# Patient Record
Sex: Female | Born: 1985 | Race: Black or African American | Hispanic: No | Marital: Single | Smoking: Never smoker
Health system: Southern US, Community
[De-identification: ages and names within clinical notes are randomized; demographics above are authoritative.]

## PROBLEM LIST (undated history)

## (undated) DIAGNOSIS — K219 Gastro-esophageal reflux disease without esophagitis: Secondary | ICD-10-CM

## (undated) DIAGNOSIS — I499 Cardiac arrhythmia, unspecified: Secondary | ICD-10-CM

## (undated) DIAGNOSIS — T7840XA Allergy, unspecified, initial encounter: Secondary | ICD-10-CM

## (undated) DIAGNOSIS — R7303 Prediabetes: Secondary | ICD-10-CM

## (undated) HISTORY — DX: Allergy, unspecified, initial encounter: T78.40XA

## (undated) HISTORY — DX: Prediabetes: R73.03

---

## 2009-10-02 ENCOUNTER — Emergency Department: Payer: Self-pay | Admitting: Emergency Medicine

## 2009-10-25 ENCOUNTER — Emergency Department: Payer: Self-pay | Admitting: Emergency Medicine

## 2009-11-07 ENCOUNTER — Emergency Department: Payer: Self-pay | Admitting: Internal Medicine

## 2010-11-06 IMAGING — CR DG CHEST 2V
1 series · 2 of 2 positions shown · non-contrast
Comparison: none

REASON FOR EXAM: SOB
COMMENTS:

[Series 1: view not recorded · 0.17mm/px · 2 of 2 slices shown]
[im 1/2]
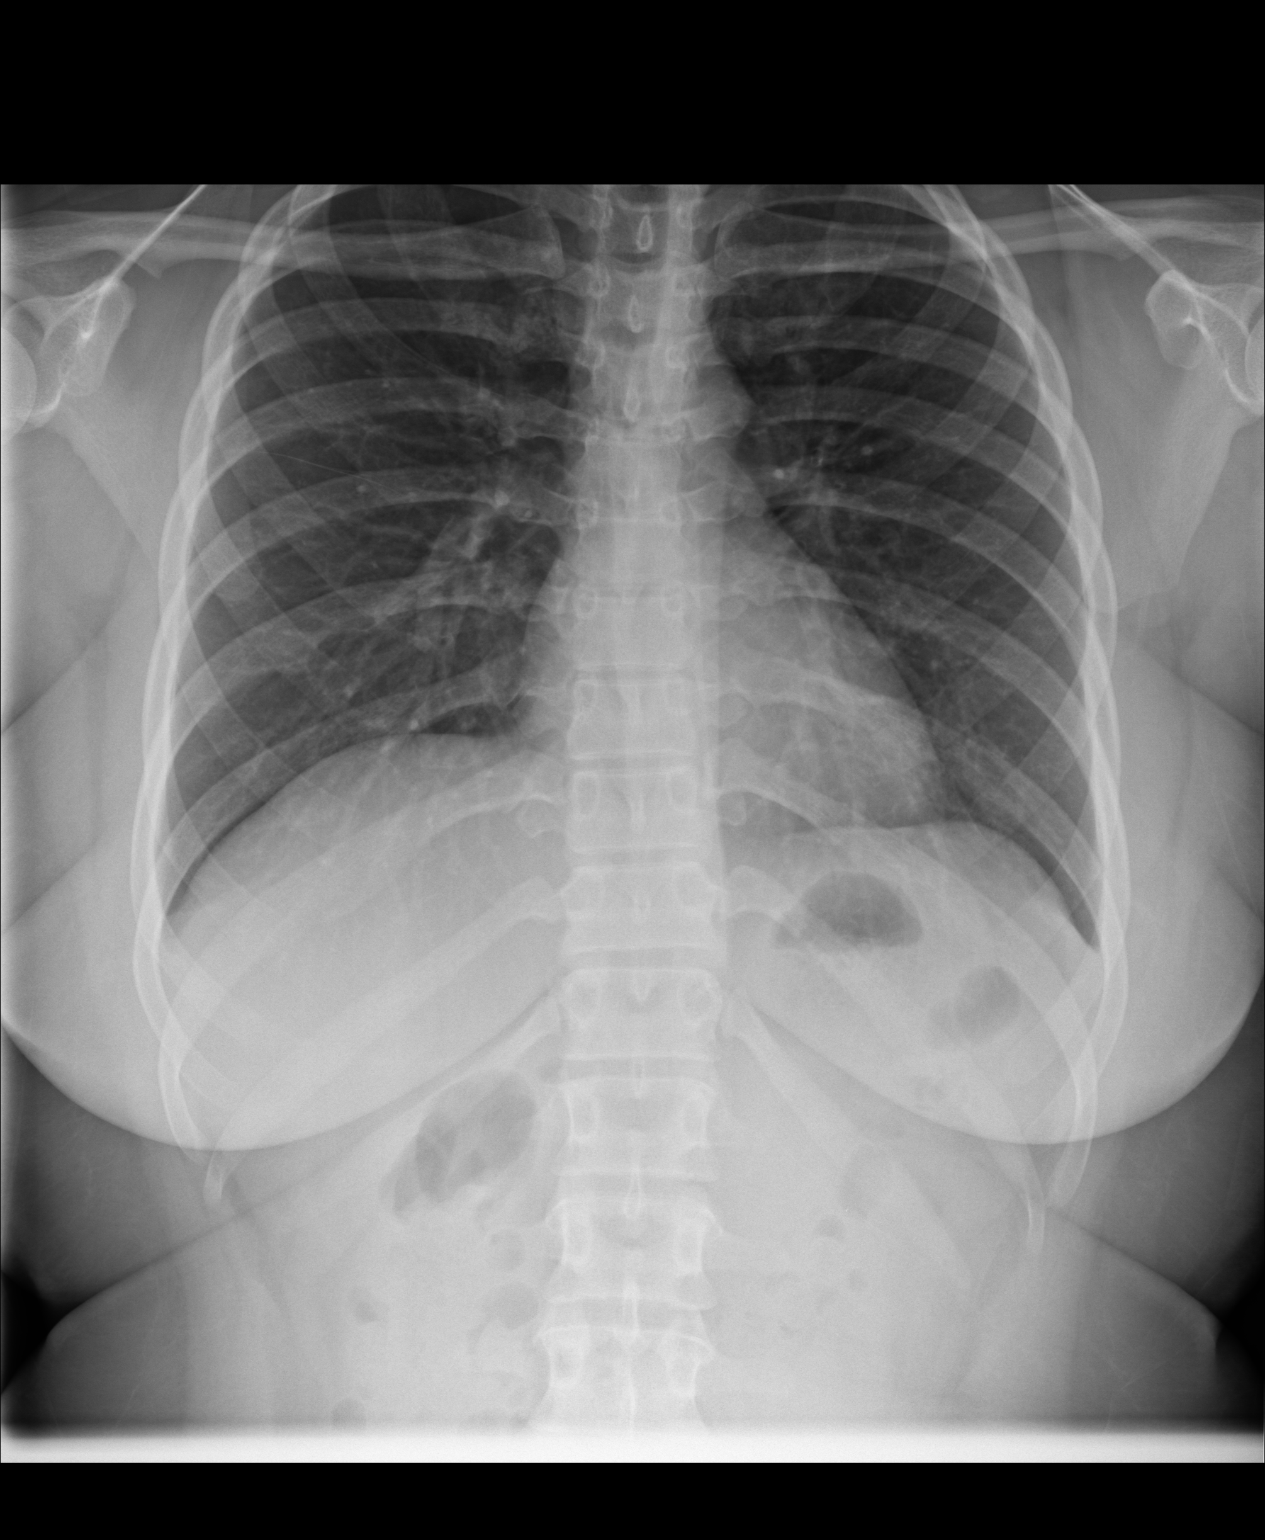
[im 2/2]
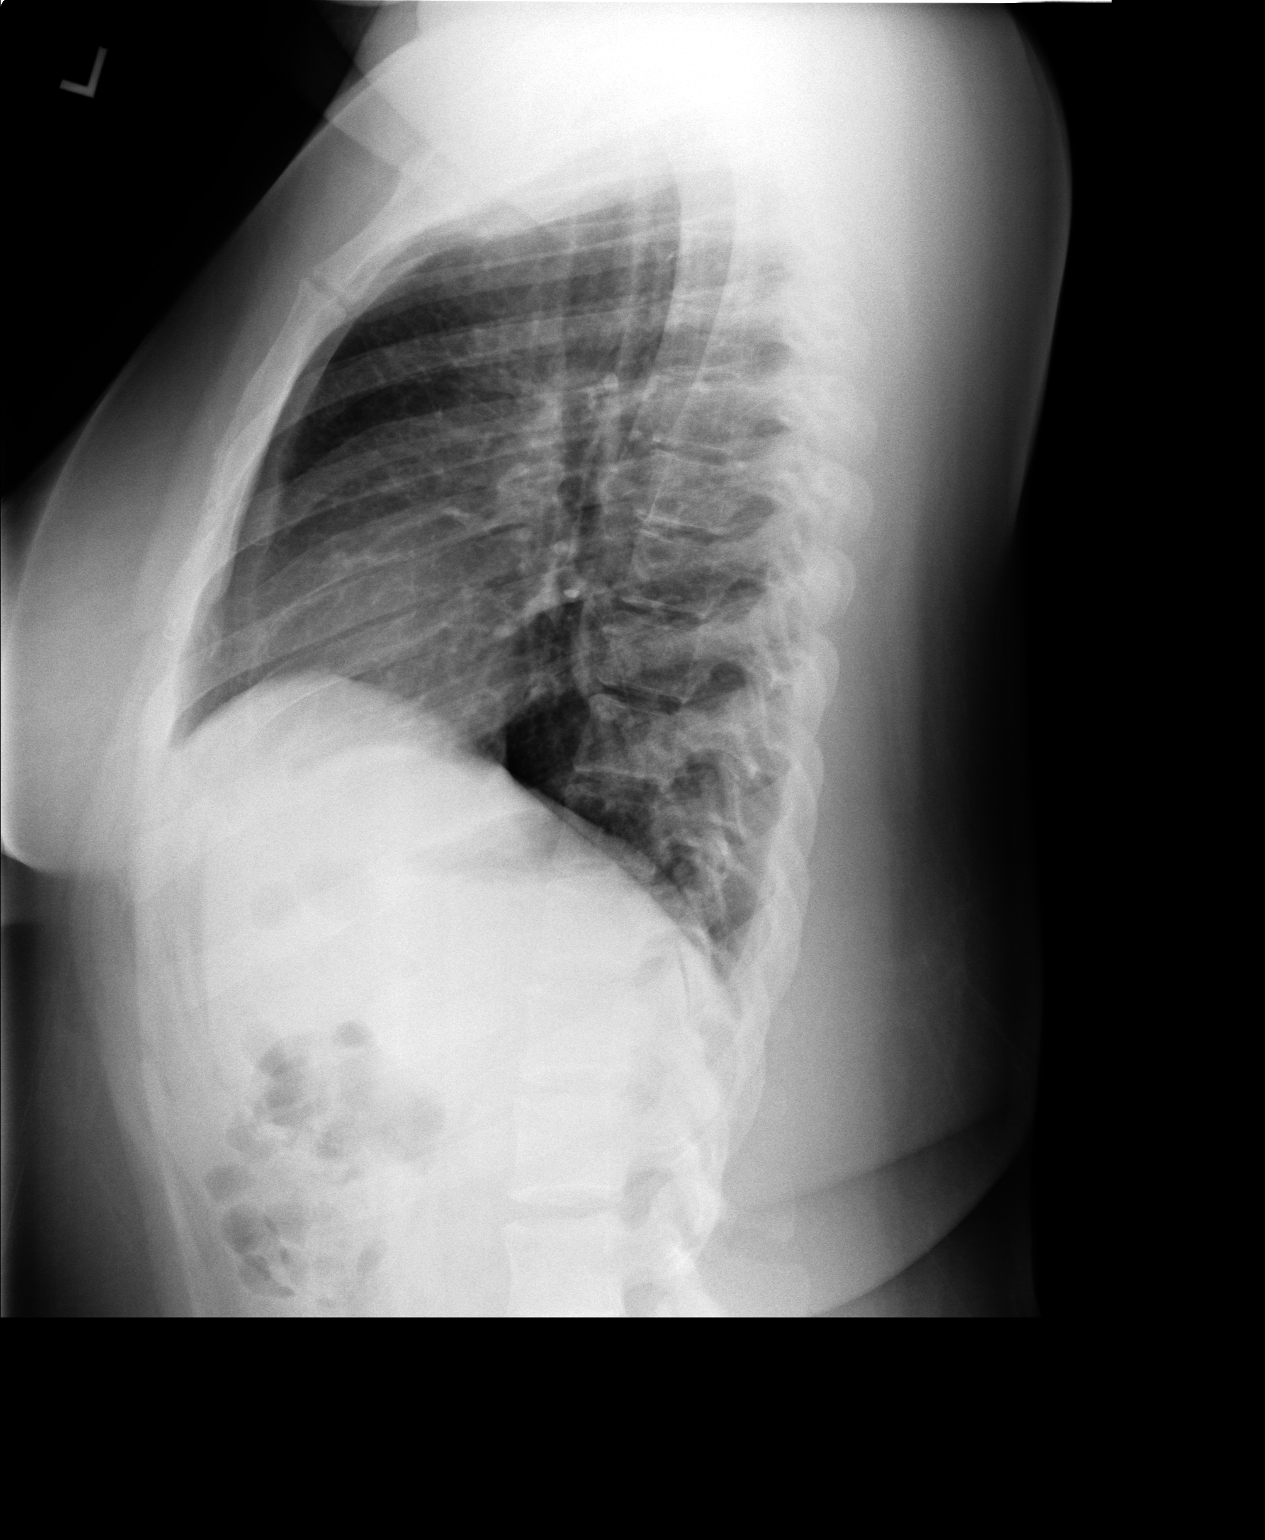

[2 of 2 positions shown; findings below may reference images not displayed]

PROCEDURE:     DXR - DXR CHEST PA (OR AP) AND LATERAL  - November 07, 2009  [DATE]

RESULT:     There is no previous examination for comparison.

The lungs are clear. The heart and pulmonary vessels are normal. The bony
and mediastinal structures are unremarkable. There is no effusion. There is
no pneumothorax or evidence of congestive failure.
IMPRESSION: No acute cardiopulmonary disease.

## 2012-03-17 ENCOUNTER — Emergency Department: Payer: Self-pay | Admitting: Emergency Medicine

## 2014-03-15 ENCOUNTER — Emergency Department: Payer: Self-pay | Admitting: Emergency Medicine

## 2014-03-17 LAB — BETA STREP CULTURE(ARMC)

## 2014-04-19 ENCOUNTER — Emergency Department: Payer: Self-pay | Admitting: Emergency Medicine

## 2014-04-21 LAB — BETA STREP CULTURE(ARMC)

## 2014-08-08 ENCOUNTER — Emergency Department: Payer: Self-pay | Admitting: Emergency Medicine

## 2018-09-29 ENCOUNTER — Other Ambulatory Visit: Payer: Self-pay | Admitting: Obstetrics and Gynecology

## 2020-08-12 ENCOUNTER — Other Ambulatory Visit: Payer: Self-pay

## 2020-08-12 ENCOUNTER — Emergency Department (HOSPITAL_COMMUNITY)
Admission: EM | Admit: 2020-08-12 | Discharge: 2020-08-12 | Disposition: A | Discharge status: Home / Self Care | Payer: 59 | Attending: Emergency Medicine | Admitting: Emergency Medicine

## 2020-08-12 DIAGNOSIS — Z20822 Contact with and (suspected) exposure to covid-19: Secondary | ICD-10-CM | POA: Insufficient documentation

## 2020-08-12 DIAGNOSIS — R112 Nausea with vomiting, unspecified: Secondary | ICD-10-CM | POA: Insufficient documentation

## 2020-08-12 DIAGNOSIS — R5383 Other fatigue: Secondary | ICD-10-CM | POA: Diagnosis not present

## 2020-08-12 DIAGNOSIS — R6883 Chills (without fever): Secondary | ICD-10-CM | POA: Diagnosis present

## 2020-08-12 LAB — SARS CORONAVIRUS 2 BY RT PCR (HOSPITAL ORDER, PERFORMED IN ~~LOC~~ HOSPITAL LAB): SARS Coronavirus 2: NEGATIVE

## 2020-08-12 MED ORDER — ONDANSETRON HCL 4 MG PO TABS: 4.0000 mg | ORAL_TABLET | Freq: Four times a day (QID) | ORAL | 0 refills | Status: DC

## 2020-08-12 NOTE — ED Notes (Signed)
Reviewed D/C instructions w/ pt along w/ follow-up care, how to obtain a PCP and education about bland diets and how to take her nausea medication. Pt verbalized understanding. Pt A&Ox4, VSS, ambulatory w/ steady gait upon departure.

## 2020-08-12 NOTE — ED Triage Notes (Signed)
per pt she has been having chills, fatigue generalized body aches, x 5 days.

## 2020-08-12 NOTE — Discharge Instructions (Addendum)
As discussed, your symptoms are likely due to a viral stomach bug.  You may take the Zofran as needed for nausea.  Start reintroducing food slowly, I have provided a bland diet handout which gives you some examples.  Make sure to return to the ER if you have any new or worsening symptoms.  Please call the phone number in your discharge paperwork in order to establish with a primary care provider.  You may check your Covid test results on MyChart.

## 2020-08-12 NOTE — ED Provider Notes (Addendum)
[facility] EMERGENCY DEPARTMENT Provider Note   CSN: [id number] Arrival date & time: 08/12/20  0351     History Chief Complaint  Patient presents with  . Chills  . Fatigue    Kristina Lopez is a 34 y.o. female.  HPI 34 year old female with no significant medical history presents to the ER with 4 days of nausea, vomiting and chills.  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  States on Wednesday she started to feel unwell and had some episodes of nausea.  On Thursday she began to have several episodes of nonbloody and nonbilious vomiting.  Has been able to keep some crackers down, but overall has not had much of an appetite.  She has had no measurable fevers.  She denies any abdominal pain, vaginal pain, discharge, dysuria, cough, runny nose, back pain, hematuria, sore throat, ear pain, headaches.  Has not taken anything for her symptoms.  She is not vaccinated for, has plans to go get vaccinated tomorrow.    No past medical history on file.  There are no problems to display for this patient.   The histories are not reviewed yet. Please review them in the "History" navigator section and refresh this SmartLink.   OB History   No obstetric history on file.     No family history on file.  Social History   Tobacco Use  . Smoking status: Not on file  Substance Use Topics  . Alcohol use: Not on file  . Drug use: Not on file    Home Medications Prior to Admission medications   Medication Sig Start Date End Date Taking? Authorizing Provider  ondansetron (ZOFRAN) 4 MG tablet Take 1 tablet (4 mg total) by mouth every 6 (six) hours. 08/12/20   Mare Ferrari, PA-C    Allergies    Patient has no allergy information on record.  Review of Systems   Review of Systems  Constitutional: Positive for chills. Negative for fever.  Respiratory: Negative for cough and shortness of breath.   Cardiovascular: Negative for chest pain.   Gastrointestinal: Positive for nausea and vomiting. Negative for constipation and diarrhea.  Genitourinary: Negative for dysuria, vaginal discharge and vaginal pain.  Musculoskeletal: Negative for back pain.  Neurological: Negative for headaches.    Physical Exam Updated Vital Signs BP 104/63 (BP Location: Right Arm)   Pulse 80   Temp 98.4 F (36.9 C) (Oral)   Resp 16   SpO2 100%   Physical Exam Vitals and nursing note reviewed.  Constitutional:      General: She is not in acute distress.    Appearance: She is well-developed.     Comments: Very well-appearing, sitting up in the ER bed  HENT:     Head: Normocephalic and atraumatic.     Mouth/Throat:     Mouth: Mucous membranes are moist.     Pharynx: Oropharynx is clear.  Eyes:     Conjunctiva/sclera: Conjunctivae normal.  Cardiovascular:     Rate and Rhythm: Normal rate and regular rhythm.     Pulses: Normal pulses.     Heart sounds: Normal heart sounds. No murmur heard.   Pulmonary:     Effort: Pulmonary effort is normal. No respiratory distress.     Breath sounds: Normal breath sounds.     Comments: Respirations equal and unlabored, patient able to speak in full sentences, lungs clear to auscultation bilaterally  Abdominal:     General: Abdomen  is flat.     Palpations: Abdomen is soft.     Tenderness: There is no abdominal tenderness.  Musculoskeletal:        General: Normal range of motion.     Cervical back: Normal range of motion and neck supple.     Comments: Moving all 4 extremities without difficulty  Skin:    General: Skin is warm and dry.  Neurological:     Mental Status: She is alert.     ED Results / Procedures / Treatments   Labs (all labs ordered are listed, but only abnormal results are displayed) Labs Reviewed  SARS CORONAVIRUS 2 BY RT PCR (HOSPITAL ORDER, PERFORMED IN [facility])    EKG None  Radiology No results found.  Procedures Procedures (including critical care  time)  Medications Ordered in ED Medications - No data to display  ED Course  I have reviewed the triage vital signs and the nursing notes.  Pertinent labs & imaging results that were available during my care of the patient were reviewed by me and considered in my medical decision making (see chart for details).    MDM Rules/Calculators/A&P                         34 year old female with several days of nausea, vomiting and chills.  Overall very well-appearing, appears to be nontoxic, no acute distress.  Abdomen soft and nontender.  Lungs clear to auscultation.  Throat without evidence of erythema.  No flank tenderness.  Patient denies any dysuria, vaginal pain or discharge.  No signs of a URI.   Covid test is negative here today. Do not think she needs any additional labwork at this time. Does not appear dry, mucous membranes moist.  Suspect she has some viral gastroenteritis.  I offered the patient some IV fluids however she denied this.  States that she still has been able to eat and drink some.  Will provide the patient with some Zofran, and very strict return precautions discussed.  She voices understanding is agreeable.  Patient does not have a primary care provider, I encouraged the patient to call the phone number in the discharge paperwork in order to establish with 1.  Patient was requesting Covid test results, and I directed her to quadrant.  Overall, the patient is medically screened and is stable for discharge.  Final Clinical Impression(s) / ED Diagnoses Final diagnoses:  Non-intractable vomiting with nausea, unspecified vomiting type    Rx / DC Orders ED Discharge Orders         Ordered    ondansetron (ZOFRAN) 4 MG tablet  Every 6 hours        08/12/20 1522               Leone Brand 08/12/20 1527    Gwyneth Sprout, MD 08/12/20 2030

## 2022-02-14 ENCOUNTER — Encounter (HOSPITAL_COMMUNITY): Payer: Self-pay | Admitting: Emergency Medicine

## 2022-02-14 ENCOUNTER — Emergency Department (HOSPITAL_COMMUNITY): Payer: 59

## 2022-02-14 ENCOUNTER — Other Ambulatory Visit: Payer: Self-pay

## 2022-02-14 ENCOUNTER — Emergency Department (HOSPITAL_COMMUNITY)
Admission: EM | Admit: 2022-02-14 | Discharge: 2022-02-14 | Disposition: A | Discharge status: Home / Self Care | Payer: 59 | Attending: Emergency Medicine | Admitting: Emergency Medicine

## 2022-02-14 DIAGNOSIS — M25512 Pain in left shoulder: Secondary | ICD-10-CM | POA: Diagnosis present

## 2022-02-14 DIAGNOSIS — M7918 Myalgia, other site: Secondary | ICD-10-CM

## 2022-02-14 DIAGNOSIS — R0789 Other chest pain: Secondary | ICD-10-CM | POA: Insufficient documentation

## 2022-02-14 HISTORY — DX: Cardiac arrhythmia, unspecified: I49.9

## 2022-02-14 HISTORY — DX: Gastro-esophageal reflux disease without esophagitis: K21.9

## 2022-02-14 LAB — CBC WITH DIFFERENTIAL/PLATELET
Abs Immature Granulocytes: 0.01 10*3/uL (ref 0.00–0.07)
Basophils Absolute: 0 10*3/uL (ref 0.0–0.1)
Basophils Relative: 1 %
Eosinophils Absolute: 0 10*3/uL (ref 0.0–0.5)
Eosinophils Relative: 0 %
HCT: 35.9 % — ABNORMAL LOW (ref 36.0–46.0)
Hemoglobin: 11.6 g/dL — ABNORMAL LOW (ref 12.0–15.0)
Immature Granulocytes: 0 %
Lymphocytes Relative: 38 %
Lymphs Abs: 1.8 10*3/uL (ref 0.7–4.0)
MCH: 29.6 pg (ref 26.0–34.0)
MCHC: 32.3 g/dL (ref 30.0–36.0)
MCV: 91.6 fL (ref 80.0–100.0)
Monocytes Absolute: 0.3 10*3/uL (ref 0.1–1.0)
Monocytes Relative: 6 %
Neutro Abs: 2.7 10*3/uL (ref 1.7–7.7)
Neutrophils Relative %: 55 %
Platelets: 274 10*3/uL (ref 150–400)
RBC: 3.92 MIL/uL (ref 3.87–5.11)
RDW: 12.5 % (ref 11.5–15.5)
WBC: 4.8 10*3/uL (ref 4.0–10.5)
nRBC: 0 % (ref 0.0–0.2)

## 2022-02-14 LAB — TROPONIN I (HIGH SENSITIVITY): Troponin I (High Sensitivity): <2 ng/L (ref <18)

## 2022-02-14 LAB — BASIC METABOLIC PANEL
Anion gap: 9 (ref 5–15)
BUN: 9 mg/dL (ref 6–20)
CO2: 25 mmol/L (ref 22–32)
Calcium: 9.2 mg/dL (ref 8.9–10.3)
Chloride: 103 mmol/L (ref 98–111)
Creatinine, Ser: 0.75 mg/dL (ref 0.44–1.00)
GFR, Estimated: >60 mL/min (ref >60)
Glucose, Bld: 89 mg/dL (ref 70–99)
Potassium: 4.1 mmol/L (ref 3.5–5.1)
Sodium: 137 mmol/L (ref 135–145)

## 2022-02-14 MED ORDER — DICLOFENAC SODIUM 50 MG PO TBEC: 50.0000 mg | DELAYED_RELEASE_TABLET | Freq: Two times a day (BID) | ORAL | 0 refills | Status: AC

## 2022-02-14 MED ORDER — LIDOCAINE 5 % EX PTCH
1.0000 | MEDICATED_PATCH | CUTANEOUS | 0 refills | Status: DC
Start: 2022-02-14 — End: 2022-03-13

## 2022-02-14 MED ORDER — METHOCARBAMOL 500 MG PO TABS: 500.0000 mg | ORAL_TABLET | Freq: Two times a day (BID) | ORAL | 0 refills | Status: DC

## 2022-02-14 NOTE — ED Provider Notes (Signed)
[facility] EMERGENCY DEPARTMENT Provider Note   CSN: [id number] Arrival date & time: 02/14/22  1011     History  Chief Complaint  Patient presents with   Chest Pain    Kristina Lopez is a 36 y.o. female.  36 year old female presents with complaint of a sharp pain in her left scapular area that started on Monday, has slowly progressed to her generalized shoulder area and now into her left pectoral area.  Pain occasionally shoots down towards her elbow.  Is reproduced with palpation and movement.  Denies associated shortness of breath, diaphoresis, nausea or vomiting.  States that she did go to a cardiologist last month when she was having episode of lightheadedness, had a Holter monitor and shows me the report which shows she had 2 episodes with a heart rate of 102 corresponding with her symptoms, otherwise unremarkable. Patient is otherwise healthy, non-smoker, no family cardiac history.      Home Medications Prior to Admission medications   Medication Sig Start Date End Date Taking? Authorizing Provider  diclofenac (VOLTAREN) 50 MG EC tablet Take 1 tablet (50 mg total) by mouth 2 (two) times daily for 10 days. 02/14/22 02/24/22 Yes Jeannie Fend, PA-C  lidocaine (LIDODERM) 5 % Place 1 patch onto the skin daily. Remove & Discard patch within 12 hours or as directed by MD 02/14/22  Yes Jeannie Fend, PA-C  methocarbamol (ROBAXIN) 500 MG tablet Take 1 tablet (500 mg total) by mouth 2 (two) times daily. 02/14/22  Yes Jeannie Fend, PA-C  ondansetron (ZOFRAN) 4 MG tablet Take 1 tablet (4 mg total) by mouth every 6 (six) hours. 08/12/20   Mare Ferrari, PA-C      Allergies    Penicillins    Review of Systems   Review of Systems Negative except as per HPI Physical Exam Updated Vital Signs BP 134/73 (BP Location: Right Arm)    Pulse 94    Temp 99.6 F (37.6 C) (Oral)    Resp 17    Ht 5\' 2"  (1.575 m)    Wt 76.7 kg    LMP 02/14/2022    SpO2 100%    BMI 30.91 kg/m   Physical Exam Vitals and nursing note reviewed.  Constitutional:      General: She is not in acute distress.    Appearance: She is well-developed. She is not diaphoretic.  HENT:     Head: Normocephalic and atraumatic.  Cardiovascular:     Rate and Rhythm: Normal rate and regular rhythm.     Heart sounds: Normal heart sounds. No murmur heard. Pulmonary:     Effort: Pulmonary effort is normal.     Breath sounds: Normal breath sounds.  Chest:     Chest wall: Tenderness present.    Musculoskeletal:     Cervical back: Neck supple.       Back:     Right lower leg: No edema.     Left lower leg: No edema.  Skin:    General: Skin is warm and dry.     Findings: No erythema or rash.  Neurological:     Mental Status: She is alert and oriented to person, place, and time.  Psychiatric:        Behavior: Behavior normal.    ED Results / Procedures / Treatments   Labs (all labs ordered are listed, but only abnormal results are displayed) Labs Reviewed  CBC WITH DIFFERENTIAL/PLATELET - Abnormal; Notable for the following components:  Result Value   Hemoglobin 11.6 (*)    HCT 35.9 (*)    All other components within normal limits  BASIC METABOLIC PANEL  TROPONIN I (HIGH SENSITIVITY)    EKG None  Radiology DG Chest 2 View  Result Date: 02/14/2022 CLINICAL DATA:  Chest pain. EXAM: CHEST - 2 VIEW COMPARISON:  November 07, 2009. FINDINGS: No consolidation. No visible pleural effusions or pneumothorax. Cardiomediastinal silhouette is within normal limits and similar to prior. No evidence of acute osseous abnormality. Mild S-shaped thoracolumbar curvature. IMPRESSION: No evidence of acute cardiopulmonary disease. Electronically Signed   By: Feliberto Harts M.D.   On: 02/14/2022 11:04    Procedures Procedures    Medications Ordered in ED Medications - No data to display  ED Course/ Medical Decision Making/ A&P Clinical Course as of 02/14/22 1225  Thu Feb 14, 2022  3252  36 year old female with complaint of pain in her left shoulder and left pectoral areas as above.  Pain is reproduced with palpation of these areas as well as with range of motion of left shoulder. Vital signs are reassuring, work-up overall reassuring including CBC with normal white blood cell count, no profound anemia, BMP without electrolyte disturbance, normal renal function.  Troponin is less than 2.  EKG without ischemic changes.  Chest x-ray is unremarkable. As pain is reproduced with palpation and movement, suspect musculoskeletal in nature, will treat with Lidoderm patch, Robaxin and diclofenac.  Recommend compresses for 20 minutes at a time followed by gentle stretching.  Advised to recheck with her PCP if her symptoms continue. [LM]    Clinical Course User Index [LM] Jeannie Fend, PA-C                           Medical Decision Making         Final Clinical Impression(s) / ED Diagnoses Final diagnoses:  Musculoskeletal pain    Rx / DC Orders ED Discharge Orders          Ordered    lidocaine (LIDODERM) 5 %  Every 24 hours        02/14/22 1219    methocarbamol (ROBAXIN) 500 MG tablet  2 times daily        02/14/22 1219    diclofenac (VOLTAREN) 50 MG EC tablet  2 times daily        02/14/22 1219              Alden Hipp 02/14/22 1225    Pollyann Savoy, MD 02/14/22 1524

## 2022-02-14 NOTE — Discharge Instructions (Signed)
Apply Lidoderm patch as prescribed.  Take diclofenac and Robaxin as needed as prescribed.  Apply warm compresses to the area followed by gentle stretching as discussed.  Follow-up with your primary care provider if not improving, he may benefit from a referral to physical therapy.

## 2022-02-14 NOTE — ED Notes (Signed)
Monitor and signature pad unavailable for d/c vitals.

## 2022-02-14 NOTE — ED Provider Triage Note (Signed)
Emergency Medicine Provider Triage Evaluation Note  Kristina Lopez , a 36 y.o. female  was evaluated in triage.  Pt complains of chest pain.  Patient states that beginning Monday she started noticing pain in her left scapula.  She states that it is slowly migrated to left-sided chest pain that she describes as heaviness and constant with episodes of short sharp shooting pain.  She states that she also has pain that has radiated down her left arm to her left elbow that she describes as sharp.  She does endorse mild shortness of breath intermittently as well as palpitations.  She denies any lightheadedness or dizziness.  She was seen by cardiologist in January and had a Holter monitor which showed episodes of tachycardia as well as a few supraventricular beats.  She was placed on magnesium for this. Review of Systems  Positive: See above Negative:   Physical Exam  BP 134/73 (BP Location: Right Arm)    Pulse 94    Temp 99.6 F (37.6 C) (Oral)    Resp 17    Ht 5\' 2"  (1.575 m)    Wt 76.7 kg    LMP 02/14/2022    SpO2 100%    BMI 30.91 kg/m  Gen:   Awake, no distress   Resp:  Normal effort  MSK:   Moves extremities without difficulty  Other:  S1/S2 without murmur or tachycardia.  Lungs clear to auscultation bilaterally  Medical Decision Making  Medically screening exam initiated at 10:36 AM.  Appropriate orders placed.  Kristina Lopez was informed that the remainder of the evaluation will be completed by another provider, this initial triage assessment does not replace that evaluation, and the importance of remaining in the ED until their evaluation is complete.     Pamalee Leyden, PA-C 02/14/22 1037

## 2022-02-14 NOTE — ED Notes (Signed)
Pt escorted to lobby. Verbalized understanding discharge instructions. In no acute distress.

## 2022-02-14 NOTE — ED Triage Notes (Signed)
Patient reports Monday morning began having left shoulder pain then Tuesday moved into left chest and shoulder.

## 2022-03-11 ENCOUNTER — Ambulatory Visit: Payer: 59 | Admitting: Nurse Practitioner

## 2022-03-13 ENCOUNTER — Ambulatory Visit: Payer: 59 | Admitting: Nurse Practitioner

## 2022-03-13 ENCOUNTER — Encounter: Payer: Self-pay | Admitting: Nurse Practitioner

## 2022-03-13 VITALS — BP 107/72 | HR 83 | Temp 97.1°F | Ht 62.0 in | Wt 162.6 lb

## 2022-03-13 DIAGNOSIS — R42 Dizziness and giddiness: Secondary | ICD-10-CM | POA: Diagnosis not present

## 2022-03-13 DIAGNOSIS — K219 Gastro-esophageal reflux disease without esophagitis: Secondary | ICD-10-CM | POA: Insufficient documentation

## 2022-03-13 DIAGNOSIS — R7303 Prediabetes: Secondary | ICD-10-CM | POA: Diagnosis not present

## 2022-03-13 DIAGNOSIS — G44209 Tension-type headache, unspecified, not intractable: Secondary | ICD-10-CM | POA: Diagnosis not present

## 2022-03-13 MED ORDER — OMEPRAZOLE 20 MG PO CPDR: 20.0000 mg | DELAYED_RELEASE_CAPSULE | Freq: Every day | ORAL | 1 refills | Status: DC

## 2022-03-13 NOTE — Assessment & Plan Note (Signed)
A1c on 10/2021 was 5.8% discussed that this is considered prediabetes.  Encouraged her to limit her sugars, carbohydrates, limit white bread, pasta and that she can eat whole grains.  This is especially important with her family history of diabetes as we do not want this to progress there.  We will check A1c next visit. ?

## 2022-03-13 NOTE — Assessment & Plan Note (Addendum)
She has recent history of lightheadedness and palpitations.  She has seen cardiology and work-up has been negative.  They started her on magnesium which has helped her symptoms and she denies any recent lightheadedness or palpitations.  She stated that she is being referred for sleep study and will have that at the end of this month.  Encouraged her to make sure she is drinking plenty of fluid.  Last CMP, CBC reviewed from 01/2022.  Continue collaboration recommendations from cardiology. ?

## 2022-03-13 NOTE — Assessment & Plan Note (Signed)
Intermittent tension headaches, she states they do not happen frequently.  Discussed preventing them with getting adequate sleep and limiting stress.  She can take ibuprofen or Tylenol as needed and also try a hot shower or heat with active headaches.  Follow-up if symptoms are occurring more frequently or with any concerns. ?

## 2022-03-13 NOTE — Progress Notes (Addendum)
? ?New Patient Office Visit ? ?Subjective:  ?Patient ID: Kristina Lopez, female    DOB: 07/29/1986  Age: 36 y.o. MRN: [id number] ? ?CC:  ?Chief Complaint  ?Patient presents with  ? Establish Care  ?  Np. Est care. No main concerns.  ? ? ?HPI ?Kristina LeydenWhitney R Lopez presents for new patient visit to establish care.  Introduced to Publishing rights managernurse practitioner role and practice setting.  All questions answered.  Discussed provider/patient relationship and expectations. ? ?Kristina Lopez has a recent history of lightheadedness and palpitations.  She was sent to cardiology and wore a heart monitor for 48 hours which did not show anything according to her.  She was started on magnesium which has helped her symptoms.  She was also told that she might have sleep apnea and has a plan for a sleep study at the end of this month.  She does not drink caffeine. ? ?She has a history of ongoing acid reflux.  She has tried Pepcid over-the-counter which did not help her symptoms.  She has been avoiding triggers such as caffeine and chocolate.  She has the symptoms multiple times a week.  She denies nausea, vomiting, constipation, diarrhea. ? ?She endorses headaches intermittently. She went to urgent care and was diagnosed with tension headaches. The pain is located in the back of her head. She tried tylenol and ibuprofen however this does not always help. She denies stress. This is associated with neck pain. She says she has a light headache right now.  She denies nausea and vomiting. ? ?Depression and anxiety screening done: ? ? ?  03/13/2022  ?  3:38 PM  ?Depression screen PHQ 2/9  ?Decreased Interest 1  ?Down, Depressed, Hopeless 1  ?PHQ - 2 Score 2  ?Altered sleeping 1  ?Tired, decreased energy 1  ?Change in appetite 0  ?Feeling bad or failure about yourself  0  ?Trouble concentrating 0  ?Moving slowly or fidgety/restless 0  ?Suicidal thoughts 0  ?PHQ-9 Score 4  ?Difficult doing work/chores Somewhat difficult  ? ? ?  03/13/2022  ?  3:38 PM  ?GAD 7 :  Generalized Anxiety Score  ?Nervous, Anxious, on Edge 0  ?Control/stop worrying 0  ?Worry too much - different things 1  ?Trouble relaxing 0  ?Restless 0  ?Easily annoyed or irritable 0  ?Afraid - awful might happen 0  ?Total GAD 7 Score 1  ?Anxiety Difficulty Somewhat difficult  ? ? ?Past Medical History:  ?Diagnosis Date  ? Allergy   ? GERD (gastroesophageal reflux disease)   ? Irregular heart beat   ? Prediabetes   ? ? ?History reviewed. No pertinent surgical history. ? ?Family History  ?Problem Relation Age of Onset  ? Alcohol abuse Father   ? Diabetes Father   ? Asthma Sister   ? Diabetes Maternal Aunt   ? COPD Maternal Uncle   ? Diabetes Paternal Aunt   ? Cancer Maternal Grandmother   ?     stomach  ? Diabetes Maternal Grandmother   ? Stroke Maternal Grandmother   ? ? ?Social History  ? ?Socioeconomic History  ? Marital status: Single  ?  Spouse name: Not on file  ? Number of children: Not on file  ? Years of education: Not on file  ? Highest education level: Not on file  ?Occupational History  ? Not on file  ?Tobacco Use  ? Smoking status: Never  ? Smokeless tobacco: Never  ?Vaping Use  ? Vaping Use: Never used  ?Substance  and Sexual Activity  ? Alcohol use: Never  ? Drug use: Never  ? Sexual activity: Yes  ?Other Topics Concern  ? Not on file  ?Social History Narrative  ? Not on file  ? ?Social Determinants of Health  ? ?Financial Resource Strain: Not on file  ?Food Insecurity: Not on file  ?Transportation Needs: Not on file  ?Physical Activity: Not on file  ?Stress: Not on file  ?Social Connections: Not on file  ?Intimate Partner Violence: Not on file  ? ? ?ROS ?Review of Systems  ?Constitutional:  Positive for fatigue.  ?HENT: Negative.    ?Eyes: Negative.   ?Respiratory: Negative.    ?Cardiovascular: Negative.   ?Gastrointestinal:  Negative for blood in stool, constipation, diarrhea, nausea and vomiting.  ?     Heart burn  ?Endocrine: Negative.   ?Genitourinary: Negative.   ?Musculoskeletal: Negative.    ?Skin: Negative.   ?Neurological:  Positive for headaches. Negative for dizziness.  ?Psychiatric/Behavioral: Negative.    ? ?Objective:  ? ?Today's Vitals: BP 107/72 (BP Location: Left Arm, Patient Position: Sitting, Cuff Size: Normal)   Pulse 83   Temp (!) 97.1 ?F (36.2 ?C) (Temporal)   Ht 5\' 2"  (1.575 m)   Wt 162 lb 9.6 oz (73.8 kg)   LMP 03/10/2022   SpO2 100%   BMI 29.74 kg/m?  ? ?Physical Exam ?Vitals and nursing note reviewed.  ?Constitutional:   ?   General: She is not in acute distress. ?   Appearance: Normal appearance.  ?HENT:  ?   Head: Normocephalic.  ?Eyes:  ?   Conjunctiva/sclera: Conjunctivae normal.  ?Cardiovascular:  ?   Rate and Rhythm: Normal rate and regular rhythm.  ?   Pulses: Normal pulses.  ?   Heart sounds: Normal heart sounds.  ?Pulmonary:  ?   Effort: Pulmonary effort is normal.  ?   Breath sounds: Normal breath sounds.  ?Abdominal:  ?   Palpations: Abdomen is soft.  ?   Tenderness: There is no abdominal tenderness.  ?Musculoskeletal:  ?   Cervical back: Normal range of motion. No tenderness.  ?   Right lower leg: No edema.  ?   Left lower leg: No edema.  ?Lymphadenopathy:  ?   Cervical: No cervical adenopathy.  ?Skin: ?   General: Skin is warm.  ?Neurological:  ?   General: No focal deficit present.  ?   Mental Status: She is alert and oriented to person, place, and time.  ?Psychiatric:     ?   Mood and Affect: Mood normal.     ?   Behavior: Behavior normal.     ?   Thought Content: Thought content normal.     ?   Judgment: Judgment normal.  ? ? ?Assessment & Plan:  ? ?Problem List Items Addressed This Visit   ? ?  ? Digestive  ? Gastroesophageal reflux disease  ?  Chronic, not controlled.  She has tried Pepcid over-the-counter which has not helped her symptoms.  She has been avoiding foods that trigger her symptoms like caffeine and chocolate.  We will start omeprazole 20 mg daily.  Discussed eating small, frequent meals and not laying down after eating.  Follow-up in 3 months. ?   ?  ? Relevant Medications  ? omeprazole (PRILOSEC) 20 MG capsule  ?  ? Other  ? Lightheaded  ?  She has recent history of lightheadedness and palpitations.  She has seen cardiology and work-up has been negative.  They started  her on magnesium which has helped her symptoms and she denies any recent lightheadedness or palpitations.  She stated that she is being referred for sleep study and will have that at the end of this month.  Encouraged her to make sure she is drinking plenty of fluid.  Last CMP, CBC reviewed from 01/2022.  Continue collaboration recommendations from cardiology. ?  ?  ? Tension headache - Primary  ?  Intermittent tension headaches, she states they do not happen frequently.  Discussed preventing them with getting adequate sleep and limiting stress.  She can take ibuprofen or Tylenol as needed and also try a hot shower or heat with active headaches.  Follow-up if symptoms are occurring more frequently or with any concerns. ?  ?  ? Prediabetes  ?  A1c on 10/2021 was 5.8% discussed that this is considered prediabetes.  Encouraged her to limit her sugars, carbohydrates, limit white bread, pasta and that she can eat whole grains.  This is especially important with her family history of diabetes as we do not want this to progress there.  We will check A1c next visit. ?  ?  ? ? ?Outpatient Encounter Medications as of 03/13/2022  ?Medication Sig  ? Magnesium 200 MG TABS Take 200 mg by mouth daily.  ? omeprazole (PRILOSEC) 20 MG capsule Take 1 capsule (20 mg total) by mouth daily.  ? [DISCONTINUED] lidocaine (LIDODERM) 5 % Place 1 patch onto the skin daily. Remove & Discard patch within 12 hours or as directed by MD  ? [DISCONTINUED] methocarbamol (ROBAXIN) 500 MG tablet Take 1 tablet (500 mg total) by mouth 2 (two) times daily.  ? [DISCONTINUED] ondansetron (ZOFRAN) 4 MG tablet Take 1 tablet (4 mg total) by mouth every 6 (six) hours.  ? ?No facility-administered encounter medications on file as of  03/13/2022.  ? ? ?Follow-up: Return in about 3 months (around 06/13/2022) for CPE.  ? ?Gerre Scull, NP ? ?

## 2022-03-13 NOTE — Assessment & Plan Note (Signed)
Chronic, not controlled.  She has tried Pepcid over-the-counter which has not helped her symptoms.  She has been avoiding foods that trigger her symptoms like caffeine and chocolate.  We will start omeprazole 20 mg daily.  Discussed eating small, frequent meals and not laying down after eating.  Follow-up in 3 months. ?

## 2022-03-13 NOTE — Patient Instructions (Signed)
It was great to see you! ? ?Start omeprazole 1 capsule daily for heartburn.  Try to eat small meals more frequently throughout the day, do not eat and then lay down right away. ? ?For her headaches he can take Tylenol or ibuprofen as needed.  He can also try heat, taking a hot shower, getting plenty of sleep.  I have attached some information on tension headaches for you. ? ?Let's follow-up in 3 months, sooner if you have concerns. ? ?If a referral was placed today, you will be contacted for an appointment. Please note that routine referrals can sometimes take up to 3-4 weeks to process. Please call our office if you haven't heard anything after this time frame. ? ?Take care, ? ?Rodman Pickle, NP ? ?

## 2022-04-09 NOTE — Progress Notes (Deleted)
   Acute Office Visit  Subjective:     Patient ID: Kristina Lopez, female    DOB: [birth year], 36 y.o.   MRN: [id number]  No chief complaint on file.   HPI Patient is in today for ***  ROS See pertinent positives and negatives per HPI.     Objective:    There were no vitals taken for this visit. {Vitals History (Optional):23777}  Physical Exam  No results found for any visits on 04/11/22.      Assessment & Plan:   Problem List Items Addressed This Visit   None   No orders of the defined types were placed in this encounter.   No follow-ups on file.  Gerre Scull, NP

## 2022-04-11 ENCOUNTER — Ambulatory Visit: Payer: 59 | Admitting: Nurse Practitioner

## 2022-07-03 ENCOUNTER — Encounter (HOSPITAL_COMMUNITY): Payer: Self-pay

## 2022-07-03 ENCOUNTER — Emergency Department (HOSPITAL_COMMUNITY)
Admission: EM | Admit: 2022-07-03 | Discharge: 2022-07-03 | Disposition: A | Discharge status: Home / Self Care | Payer: Self-pay | Attending: Emergency Medicine | Admitting: Emergency Medicine

## 2022-07-03 ENCOUNTER — Other Ambulatory Visit: Payer: Self-pay

## 2022-07-03 ENCOUNTER — Emergency Department (HOSPITAL_COMMUNITY): Payer: Self-pay

## 2022-07-03 DIAGNOSIS — R0602 Shortness of breath: Secondary | ICD-10-CM | POA: Insufficient documentation

## 2022-07-03 DIAGNOSIS — R0789 Other chest pain: Secondary | ICD-10-CM | POA: Insufficient documentation

## 2022-07-03 DIAGNOSIS — R002 Palpitations: Secondary | ICD-10-CM | POA: Insufficient documentation

## 2022-07-03 LAB — BASIC METABOLIC PANEL
Anion gap: 8 (ref 5–15)
BUN: 8 mg/dL (ref 6–20)
CO2: 27 mmol/L (ref 22–32)
Calcium: 9.3 mg/dL (ref 8.9–10.3)
Chloride: 104 mmol/L (ref 98–111)
Creatinine, Ser: 0.69 mg/dL (ref 0.44–1.00)
GFR, Estimated: >60 mL/min (ref >60)
Glucose, Bld: 112 mg/dL — ABNORMAL HIGH (ref 70–99)
Potassium: 4.1 mmol/L (ref 3.5–5.1)
Sodium: 139 mmol/L (ref 135–145)

## 2022-07-03 LAB — CBC
HCT: 38.1 % (ref 36.0–46.0)
Hemoglobin: 12.4 g/dL (ref 12.0–15.0)
MCH: 29.3 pg (ref 26.0–34.0)
MCHC: 32.5 g/dL (ref 30.0–36.0)
MCV: 90.1 fL (ref 80.0–100.0)
Platelets: 259 10*3/uL (ref 150–400)
RBC: 4.23 MIL/uL (ref 3.87–5.11)
RDW: 14 % (ref 11.5–15.5)
WBC: 4.7 10*3/uL (ref 4.0–10.5)
nRBC: 0 % (ref 0.0–0.2)

## 2022-07-03 LAB — I-STAT BETA HCG BLOOD, ED (MC, WL, AP ONLY): I-stat hCG, quantitative: <5 m[IU]/mL (ref <5)

## 2022-07-03 LAB — D-DIMER, QUANTITATIVE: D-Dimer, Quant: 0.31 ug/mL-FEU (ref 0.00–0.50)

## 2022-07-03 LAB — TROPONIN I (HIGH SENSITIVITY)
Troponin I (High Sensitivity): <2 ng/L (ref <18)
Troponin I (High Sensitivity): <2 ng/L (ref <18)

## 2022-07-03 NOTE — ED Triage Notes (Signed)
Patients chest pain began last Friday. No radiation. No jaw pain/nausea/dizziness. Seen recently for the same thing, told her it was inflammation. Was given methocarbamol and diclofenac, said it did not help her.

## 2022-07-03 NOTE — Discharge Instructions (Signed)
No evidence of heart attack or blood clot in the lung.  Follow-up with your primary doctor as well as cardiologist.  You may benefit from wearing a Holter monitor for a longer period of time.  Return to the ED with exertional chest pain, pain associate with shortness of breath, nausea, vomiting, sweating, other concerns

## 2022-07-03 NOTE — ED Provider Notes (Signed)
[facility]ENCY DEPT Provider Note   CSN: [id number] Arrival date & time: 07/03/22  5621     History  Chief Complaint  Patient presents with   Chest Pain    Kristina Lopez is a 36 y.o. female.  Patient presents with intermittent left-sided chest pain for the past 5 or 6 days.  She reports sharp stinging pain to the left chest that radiates to her arm and upper back.  Pain lasts for just a few seconds at a time but recurs.  States that the most the pain last for 2 or 3 seconds with several minutes in between episodes.  She is having 5-10 episodes per day.  Pain is not exertional or pleuritic.  There is no associated shortness of breath, nausea, vomiting, diaphoresis, dizziness, cough or fever.  She is been having this pain on and off for several months and seen a cardiologist at [facility].  She had a reassuring stress test as well as Holter monitor without diagnosis.  She has not had her thyroid checked by her knowledge.  She has cut out caffeine from her diet but still having the symptoms.  She is not having the pain currently.  When the pain, she feels strong palpitations with pain to the left side of her chest that goes to her arm.  No abdominal pain.  The pain is not exertional or pleuritic.  The history is provided by the patient.  Chest Pain Associated symptoms: shortness of breath   Associated symptoms: no abdominal pain, no dizziness, no fever, no headache, no nausea, no vomiting and no weakness        Home Medications Prior to Admission medications   Medication Sig Start Date End Date Taking? Authorizing Provider  Magnesium 200 MG TABS Take 200 mg by mouth daily.    [provider]  omeprazole (PRILOSEC) 20 MG capsule Take 1 capsule (20 mg total) by mouth daily. 03/13/22   McElwee, Jake Church, NP      Allergies    Penicillins    Review of Systems   Review of Systems  Constitutional:  Negative for activity change, appetite change and fever.   HENT:  Negative for congestion and rhinorrhea.   Respiratory:  Positive for chest tightness and shortness of breath.   Cardiovascular:  Positive for chest pain.  Gastrointestinal:  Negative for abdominal pain, nausea and vomiting.  Genitourinary:  Negative for dysuria and hematuria.  Musculoskeletal:  Negative for arthralgias and myalgias.  Skin:  Negative for rash.  Neurological:  Negative for dizziness, weakness and headaches.   all other systems are negative except as noted in the HPI and PMH.    Physical Exam Updated Vital Signs BP 122/88 (BP Location: Right Arm)   Pulse 78   Temp 98.1 F (36.7 C) (Oral)   Resp 15   Ht 5\' 1"  (1.549 m)   Wt 72.6 kg   LMP 06/24/2022 (Exact Date)   SpO2 100%   BMI 30.23 kg/m  Physical Exam Vitals and nursing note reviewed.  Constitutional:      General: She is not in acute distress.    Appearance: She is well-developed.  HENT:     Head: Normocephalic and atraumatic.     Mouth/Throat:     Pharynx: No oropharyngeal exudate.  Eyes:     Conjunctiva/sclera: Conjunctivae normal.     Pupils: Pupils are equal, round, and reactive to light.  Neck:     Comments: No meningismus. Cardiovascular:  Rate and Rhythm: Normal rate and regular rhythm.     Heart sounds: Normal heart sounds. No murmur heard. Pulmonary:     Effort: Pulmonary effort is normal. No respiratory distress.     Breath sounds: Normal breath sounds.  Chest:     Chest wall: No tenderness.  Abdominal:     Palpations: Abdomen is soft.     Tenderness: There is no abdominal tenderness. There is no guarding or rebound.  Musculoskeletal:        General: No tenderness. Normal range of motion.     Cervical back: Normal range of motion and neck supple.  Skin:    General: Skin is warm.  Neurological:     Mental Status: She is alert and oriented to person, place, and time.     Cranial Nerves: No cranial nerve deficit.     Motor: No abnormal muscle tone.     Coordination:  Coordination normal.     Comments:  5/5 strength throughout. CN 2-12 intact.Equal grip strength.   Psychiatric:        Behavior: Behavior normal.    ED Results / Procedures / Treatments   Labs (all labs ordered are listed, but only abnormal results are displayed) Labs Reviewed  BASIC METABOLIC PANEL - Abnormal; Notable for the following components:      Result Value   Glucose, Bld 112 (*)    All other components within normal limits  CBC  D-DIMER, QUANTITATIVE  I-STAT BETA HCG BLOOD, ED ([facility], [facility], [facility] ONLY)  TROPONIN I (HIGH SENSITIVITY)  TROPONIN I (HIGH SENSITIVITY)    EKG EKG Interpretation  Date/Time:  Wednesday July 03 2022 06:42:15 EDT Ventricular Rate:  94 PR Interval:  157 QRS Duration: 84 QT Interval:  333 QTC Calculation: 417 R Axis:   66 Text Interpretation: Sinus rhythm Low voltage, precordial leads Baseline wander in lead(s) V1 No significant change was found Confirmed by Glynn Octave [phone]) on 07/03/2022 7:18:09 AM  Radiology DG Chest 2 View  Result Date: 07/03/2022 CLINICAL DATA:  36 year old female with left chest pain. EXAM: CHEST - 2 VIEW COMPARISON:  02/14/2022 chest radiographs and earlier. FINDINGS: Lung volumes and mediastinal contours remain normal. Visualized tracheal air column is within normal limits. Both lungs appear clear. No pneumothorax or pleural effusion. No osseous abnormality identified.  Negative visible bowel gas. IMPRESSION: Negative.  No cardiopulmonary abnormality. Electronically Signed   By: Odessa Fleming M.D.   On: 07/03/2022 07:16    Procedures Procedures    Medications Ordered in ED Medications - No data to display  ED Course/ Medical Decision Making/ A&P                           Medical Decision Making Amount and/or Complexity of Data Reviewed Labs: ordered. Decision-making details documented in ED Course. Radiology: ordered and independent interpretation performed. Decision-making details documented in ED  Course. ECG/medicine tests: ordered and independent interpretation performed. Decision-making details documented in ED Course.  Intermittent Chest pain x 5-6 days.  History of similar.  Has had cardiology work-up with negative stress test as well as Holter monitor.  EKG here is sinus rhythm without acute ST changes.  Pain is atypical for ACS or pulmonary embolism.  Outside cardiology records reviewed.  Patient with negative stress echocardiogram in May 2023.  Holter monitor showed:" 13 supraventricular ectopic beats noted. 2 patient triggered episodes were recorded.  During those episodes strips revealed sinus rhythm with heart rate 102  with no arrhythmias noted, one artifact noted."  Work-up reassuring.  Troponin negative x2.  D-dimer is negative.  Low suspicion for ACS or pulmonary embolism.  Results discussed with patient.  May benefit from returning to her cardiologist for initial Holter monitoring and testing.  TSH is pending. No evidence of life-threatening arrhythmia.  Return to the ED with pain associate with shortness of breath, nausea, vomiting, diaphoresis, other concerns.        Final Clinical Impression(s) / ED Diagnoses Final diagnoses:  Atypical chest pain    Rx / DC Orders ED Discharge Orders     None         Arita Severtson, Jeannett Senior, MD 07/03/22 1653

## 2023-08-09 DIAGNOSIS — M5432 Sciatica, left side: Secondary | ICD-10-CM | POA: Diagnosis not present

## 2023-08-09 DIAGNOSIS — M545 Low back pain, unspecified: Secondary | ICD-10-CM | POA: Diagnosis not present

## 2023-09-08 DIAGNOSIS — R1013 Epigastric pain: Secondary | ICD-10-CM | POA: Diagnosis not present

## 2023-09-25 DIAGNOSIS — R1013 Epigastric pain: Secondary | ICD-10-CM | POA: Diagnosis not present

## 2023-11-12 DIAGNOSIS — R42 Dizziness and giddiness: Secondary | ICD-10-CM | POA: Diagnosis not present

## 2023-11-12 DIAGNOSIS — R9431 Abnormal electrocardiogram [ECG] [EKG]: Secondary | ICD-10-CM | POA: Diagnosis not present

## 2023-11-12 DIAGNOSIS — Z1330 Encounter for screening examination for mental health and behavioral disorders, unspecified: Secondary | ICD-10-CM | POA: Diagnosis not present

## 2023-11-12 DIAGNOSIS — R002 Palpitations: Secondary | ICD-10-CM | POA: Diagnosis not present

## 2024-01-06 DIAGNOSIS — J159 Unspecified bacterial pneumonia: Secondary | ICD-10-CM | POA: Diagnosis not present

## 2024-01-06 DIAGNOSIS — R197 Diarrhea, unspecified: Secondary | ICD-10-CM | POA: Diagnosis not present

## 2024-01-06 DIAGNOSIS — R051 Acute cough: Secondary | ICD-10-CM | POA: Diagnosis not present

## 2024-01-06 DIAGNOSIS — R6883 Chills (without fever): Secondary | ICD-10-CM | POA: Diagnosis not present

## 2024-01-06 DIAGNOSIS — R52 Pain, unspecified: Secondary | ICD-10-CM | POA: Diagnosis not present

## 2024-04-06 ENCOUNTER — Ambulatory Visit (INDEPENDENT_AMBULATORY_CARE_PROVIDER_SITE_OTHER): Payer: Self-pay | Admitting: Nurse Practitioner

## 2024-04-06 ENCOUNTER — Encounter: Payer: Self-pay | Admitting: Nurse Practitioner

## 2024-04-06 VITALS — BP 102/66 | HR 84 | Temp 97.3°F | Ht 61.0 in | Wt 148.2 lb

## 2024-04-06 DIAGNOSIS — K219 Gastro-esophageal reflux disease without esophagitis: Secondary | ICD-10-CM

## 2024-04-06 DIAGNOSIS — R42 Dizziness and giddiness: Secondary | ICD-10-CM | POA: Diagnosis not present

## 2024-04-06 DIAGNOSIS — D649 Anemia, unspecified: Secondary | ICD-10-CM | POA: Diagnosis not present

## 2024-04-06 DIAGNOSIS — Z0000 Encounter for general adult medical examination without abnormal findings: Secondary | ICD-10-CM | POA: Insufficient documentation

## 2024-04-06 DIAGNOSIS — Z1159 Encounter for screening for other viral diseases: Secondary | ICD-10-CM

## 2024-04-06 DIAGNOSIS — Z114 Encounter for screening for human immunodeficiency virus [HIV]: Secondary | ICD-10-CM

## 2024-04-06 DIAGNOSIS — R7303 Prediabetes: Secondary | ICD-10-CM

## 2024-04-06 LAB — LIPID PANEL
Cholesterol: 183 mg/dL (ref 0–200)
HDL: 51.9 mg/dL (ref >39.00)
LDL Cholesterol: 116 mg/dL — ABNORMAL HIGH (ref 0–99)
NonHDL: 130.78
Total CHOL/HDL Ratio: 4
Triglycerides: 76 mg/dL (ref 0.0–149.0)
VLDL: 15.2 mg/dL (ref 0.0–40.0)

## 2024-04-06 LAB — COMPREHENSIVE METABOLIC PANEL WITH GFR
ALT: 8 U/L (ref 0–35)
AST: 15 U/L (ref 0–37)
Albumin: 4.7 g/dL (ref 3.5–5.2)
Alkaline Phosphatase: 53 U/L (ref 39–117)
BUN: 10 mg/dL (ref 6–23)
CO2: 28 meq/L (ref 19–32)
Calcium: 9.5 mg/dL (ref 8.4–10.5)
Chloride: 102 meq/L (ref 96–112)
Creatinine, Ser: 0.76 mg/dL (ref 0.40–1.20)
GFR: 100.74 mL/min (ref >60.00)
Glucose, Bld: 135 mg/dL — ABNORMAL HIGH (ref 70–99)
Potassium: 3.9 meq/L (ref 3.5–5.1)
Sodium: 136 meq/L (ref 135–145)
Total Bilirubin: 0.7 mg/dL (ref 0.2–1.2)
Total Protein: 7.3 g/dL (ref 6.0–8.3)

## 2024-04-06 LAB — CBC WITH DIFFERENTIAL/PLATELET
Basophils Absolute: 0 10*3/uL (ref 0.0–0.1)
Basophils Relative: 0.5 % (ref 0.0–3.0)
Eosinophils Absolute: 0 10*3/uL (ref 0.0–0.7)
Eosinophils Relative: 0.2 % (ref 0.0–5.0)
HCT: 38.1 % (ref 36.0–46.0)
Hemoglobin: 12.3 g/dL (ref 12.0–15.0)
Lymphocytes Relative: 20.6 % (ref 12.0–46.0)
Lymphs Abs: 1.4 10*3/uL (ref 0.7–4.0)
MCHC: 32.4 g/dL (ref 30.0–36.0)
MCV: 88.5 fl (ref 78.0–100.0)
Monocytes Absolute: 0.3 10*3/uL (ref 0.1–1.0)
Monocytes Relative: 5 % (ref 3.0–12.0)
Neutro Abs: 4.9 10*3/uL (ref 1.4–7.7)
Neutrophils Relative %: 73.7 % (ref 43.0–77.0)
Platelets: 278 10*3/uL (ref 150.0–400.0)
RBC: 4.31 Mil/uL (ref 3.87–5.11)
RDW: 15.5 % (ref 11.5–15.5)
WBC: 6.6 10*3/uL (ref 4.0–10.5)

## 2024-04-06 LAB — TSH: TSH: 0.66 u[IU]/mL (ref 0.35–5.50)

## 2024-04-06 LAB — VITAMIN B12: Vitamin B-12: 394 pg/mL (ref 211–911)

## 2024-04-06 LAB — IRON: Iron: 191 ug/dL — ABNORMAL HIGH (ref 42–145)

## 2024-04-06 LAB — MAGNESIUM: Magnesium: 2 mg/dL (ref 1.5–2.5)

## 2024-04-06 LAB — HEMOGLOBIN A1C: Hgb A1c MFr Bld: 5.8 % (ref 4.6–6.5)

## 2024-04-06 LAB — FERRITIN: Ferritin: 8.2 ng/mL — ABNORMAL LOW (ref 10.0–291.0)

## 2024-04-06 NOTE — Progress Notes (Signed)
 BP 102/66 (BP Location: Left Arm, Patient Position: Sitting, Cuff Size: Normal)   Pulse 84   Temp (!) 97.3 F (36.3 C)   Ht 5\' 1"  (1.549 m)   Wt 148 lb 3.2 oz (67.2 kg)   LMP 02/26/2024 (Exact Date)   SpO2 96%   BMI 28.00 kg/m    Subjective:    Patient ID: Kristina Lopez, female    DOB: Jan 19, 1986, 38 y.o.   MRN: [id number]  CC: Chief Complaint  Patient presents with   Annual Exam    With lab work-patient is not fasting, ?PAP    HPI: Kristina Lopez is a 38 y.o. female presenting on 04/06/2024 for comprehensive medical examination. Current medical complaints include:none  She currently lives with: alone Menopausal Symptoms: no  Depression and Anxiety Screen done today and results listed below:     04/06/2024    9:04 AM 03/13/2022    3:38 PM  Depression screen PHQ 2/9  Decreased Interest 1 1  Down, Depressed, Hopeless 0 1  PHQ - 2 Score 1 2  Altered sleeping 0 1  Tired, decreased energy 1 1  Change in appetite 1 0  Feeling bad or failure about yourself  0 0  Trouble concentrating 0 0  Moving slowly or fidgety/restless 0 0  Suicidal thoughts 0 0  PHQ-9 Score 3 4  Difficult doing work/chores Not difficult at all Somewhat difficult      04/06/2024    9:04 AM 03/13/2022    3:38 PM  GAD 7 : Generalized Anxiety Score  Nervous, Anxious, on Edge 1 0  Control/stop worrying 0 0  Worry too much - different things 0 1  Trouble relaxing 0 0  Restless 0 0  Easily annoyed or irritable 0 0  Afraid - awful might happen 0 0  Total GAD 7 Score 1 1  Anxiety Difficulty Not difficult at all Somewhat difficult    The patient does not have a history of falls. I did not complete a risk assessment for falls. A plan of care for falls was not documented.   Past Medical History:  Past Medical History:  Diagnosis Date   Allergy    GERD (gastroesophageal reflux disease)    Irregular heart beat    Prediabetes     Surgical History:  History reviewed. No pertinent surgical  history.  Medications:  Current Outpatient Medications on File Prior to Visit  Medication Sig   Magnesium 200 MG TABS Take 200 mg by mouth daily.   sucralfate (CARAFATE) 1 g tablet Take 1 g by mouth 2 (two) times daily.   No current facility-administered medications on file prior to visit.    Allergies:  Allergies  Allergen Reactions   Penicillins Hives    Social History:  Social History   Socioeconomic History   Marital status: Single    Spouse name: Not on file   Number of children: Not on file   Years of education: Not on file   Highest education level: 12th grade  Occupational History   Not on file  Tobacco Use   Smoking status: Never   Smokeless tobacco: Never   Tobacco comments:    Never smoked  Vaping Use   Vaping status: Never Used  Substance and Sexual Activity   Alcohol use: Never   Drug use: Never   Sexual activity: Yes  Other Topics Concern   Not on file  Social History Narrative   Not on file   Social Drivers of  Health   Financial Resource Strain: Low Risk  (04/05/2024)   Overall Financial Resource Strain (CARDIA)    Difficulty of Paying Living Expenses: Not hard at all  Food Insecurity: No Food Insecurity (04/05/2024)   Hunger Vital Sign    Worried About Running Out of Food in the Last Year: Never true    Ran Out of Food in the Last Year: Never true  Transportation Needs: No Transportation Needs (04/05/2024)   PRAPARE - Administrator, Civil Service (Medical): No    Lack of Transportation (Non-Medical): No  Physical Activity: Insufficiently Active (04/05/2024)   Exercise Vital Sign    Days of Exercise per Week: 3 days    Minutes of Exercise per Session: 30 min  Stress: No Stress Concern Present (04/05/2024)   [employer] of Occupational Health - Occupational Stress Questionnaire    Feeling of Stress : Not at all  Social Connections: Unknown (04/05/2024)   Social Connection and Isolation Panel [NHANES]    Frequency of  Communication with Friends and Family: Once a week    Frequency of Social Gatherings with Friends and Family: Patient declined    Attends Religious Services: Never    Database administrator or Organizations: Patient declined    Attends Banker Meetings: Not on file    Marital Status: Never married  Intimate Partner Violence: Not At Risk (11/12/2023)   Received from [facility]   HITS    Over the last 12 months how often did your partner physically hurt you?: Never    Over the last 12 months how often did your partner insult you or talk down to you?: Never    Over the last 12 months how often did your partner threaten you with physical harm?: Never    Over the last 12 months how often did your partner scream or curse at you?: Never   Social History   Tobacco Use  Smoking Status Never  Smokeless Tobacco Never  Tobacco Comments   Never smoked   Social History   Substance and Sexual Activity  Alcohol Use Never    Family History:  Family History  Problem Relation Age of Onset   Alcohol abuse Father    Diabetes Father    Asthma Sister    Diabetes Maternal Aunt    COPD Maternal Uncle    Diabetes Paternal Aunt    Cancer Maternal Grandmother        stomach   Diabetes Maternal Grandmother    Stroke Maternal Grandmother     Past medical history, surgical history, medications, allergies, family history and social history reviewed with patient today and changes made to appropriate areas of the chart.   Review of Systems  Constitutional: Negative.   HENT: Negative.    Eyes: Negative.   Respiratory: Negative.    Cardiovascular: Negative.   Gastrointestinal:  Negative for constipation, diarrhea, nausea and vomiting.       Early satiety  Genitourinary: Negative.   Musculoskeletal: Negative.   Skin: Negative.   Neurological: Negative.   Psychiatric/Behavioral: Negative.     All other ROS negative except what is listed above and in the HPI.      Objective:     BP 102/66 (BP Location: Left Arm, Patient Position: Sitting, Cuff Size: Normal)   Pulse 84   Temp (!) 97.3 F (36.3 C)   Ht 5\' 1"  (1.549 m)   Wt 148 lb 3.2 oz (67.2 kg)   LMP 02/26/2024 (Exact Date)  SpO2 96%   BMI 28.00 kg/m   Wt Readings from Last 3 Encounters:  04/06/24 148 lb 3.2 oz (67.2 kg)  07/03/22 160 lb (72.6 kg)  03/13/22 162 lb 9.6 oz (73.8 kg)    Physical Exam Vitals and nursing note reviewed.  Constitutional:      General: She is not in acute distress.    Appearance: Normal appearance.  HENT:     Head: Normocephalic and atraumatic.     Right Ear: Tympanic membrane, ear canal and external ear normal.     Left Ear: Tympanic membrane, ear canal and external ear normal.     Mouth/Throat:     Mouth: Mucous membranes are moist.     Pharynx: No posterior oropharyngeal erythema.  Eyes:     Conjunctiva/sclera: Conjunctivae normal.  Cardiovascular:     Rate and Rhythm: Normal rate and regular rhythm.     Pulses: Normal pulses.     Heart sounds: Normal heart sounds.  Pulmonary:     Effort: Pulmonary effort is normal.     Breath sounds: Normal breath sounds.  Abdominal:     Palpations: Abdomen is soft.     Tenderness: There is no abdominal tenderness.  Musculoskeletal:        General: Normal range of motion.     Cervical back: Normal range of motion and neck supple.     Right lower leg: No edema.     Left lower leg: No edema.  Lymphadenopathy:     Cervical: No cervical adenopathy.  Skin:    General: Skin is warm and dry.  Neurological:     General: No focal deficit present.     Mental Status: She is alert and oriented to person, place, and time.     Cranial Nerves: No cranial nerve deficit.     Coordination: Coordination normal.     Gait: Gait normal.  Psychiatric:        Mood and Affect: Mood normal.        Behavior: Behavior normal.        Thought Content: Thought content normal.        Judgment: Judgment normal.     Results for orders placed or  performed during the hospital encounter of 07/03/22  Basic metabolic panel   Collection Time: 07/03/22  6:40 AM  Result Value Ref Range   Sodium 139 135 - 145 mmol/L   Potassium 4.1 3.5 - 5.1 mmol/L   Chloride 104 98 - 111 mmol/L   CO2 27 22 - 32 mmol/L   Glucose, Bld 112 (H) 70 - 99 mg/dL   BUN 8 6 - 20 mg/dL   Creatinine, Ser 4.09 0.44 - 1.00 mg/dL   Calcium 9.3 8.9 - 81.1 mg/dL   GFR, Estimated >91 >47 mL/min   Anion gap 8 5 - 15  CBC   Collection Time: 07/03/22  6:40 AM  Result Value Ref Range   WBC 4.7 4.0 - 10.5 K/uL   RBC 4.23 3.87 - 5.11 MIL/uL   Hemoglobin 12.4 12.0 - 15.0 g/dL   HCT 82.9 56.2 - 13.0 %   MCV 90.1 80.0 - 100.0 fL   MCH 29.3 26.0 - 34.0 pg   MCHC 32.5 30.0 - 36.0 g/dL   RDW 86.5 78.4 - 69.6 %   Platelets 259 150 - 400 K/uL   nRBC 0.0 0.0 - 0.2 %  Troponin I (High Sensitivity)   Collection Time: 07/03/22  6:40 AM  Result Value Ref Range  Troponin I (High Sensitivity) <2 <18 ng/L  I-Stat beta hCG blood, ED   Collection Time: 07/03/22  6:55 AM  Result Value Ref Range   I-stat hCG, quantitative <5.0 <5 mIU/mL   Comment 3          D-dimer, quantitative   Collection Time: 07/03/22  7:51 AM  Result Value Ref Range   D-Dimer, Quant 0.31 0.00 - 0.50 ug/mL-FEU  Troponin I (High Sensitivity)   Collection Time: 07/03/22  9:12 AM  Result Value Ref Range   Troponin I (High Sensitivity) <2 <18 ng/L      Assessment & Plan:   Problem List Items Addressed This Visit       Digestive   Gastroesophageal reflux disease   Chronic, stable. She is taking carafate 1 g BID which is controlling her symptoms. She states that she is following with GI and had a negative H pylori test. She is experiencing early satiety and has lost 15 pounds in the last 2 years without trying. Encouraged her to discuss this at her next GI appointment. Check TSH today.       Relevant Medications   sucralfate (CARAFATE) 1 g tablet     Other   Light headed   She is following with  cardiology and taking a magnesium supplement daily. Check TSH and magnesium today.       Relevant Orders   Magnesium   TSH   Prediabetes   Chronic, stable. Check A1c and treat based on results.       Relevant Orders   CBC with Differential/Platelet   Comprehensive metabolic panel with GFR   Lipid panel   Hemoglobin A1c   TSH   Anemia   Check CBC, iron, ferritin, and vitamin B12 and treat based on results.       Relevant Orders   CBC with Differential/Platelet   Vitamin B12   Ferritin   Iron   Routine general medical examination at a health care facility - Primary   Health maintenance reviewed and updated. Discussed nutrition, exercise. Follow-up 1 year.        Other Visit Diagnoses       Screening for HIV (human immunodeficiency virus)       Screen HIV today   Relevant Orders   HIV Antibody (routine testing w rflx)     Encounter for hepatitis C screening test for low risk patient       Screen hepatits C today   Relevant Orders   Hepatitis C antibody        Follow up plan: Return in about 1 year (around 04/06/2025) for CPE.   LABORATORY TESTING:  - Pap smear:  declined today  IMMUNIZATIONS:   - Tdap: Tetanus vaccination status reviewed: declined. - Influenza: Postponed to flu season - Pneumovax: Not applicable - Prevnar: Not applicable - HPV: Not applicable - Shingrix vaccine: Not applicable  SCREENING: -Mammogram: Not applicable  - Colonoscopy: Not applicable  - Bone Density: Not applicable   PATIENT COUNSELING:   Advised to take 1 mg of folate supplement per day if capable of pregnancy.   Sexuality: Discussed sexually transmitted diseases, partner selection, use of condoms, avoidance of unintended pregnancy  and contraceptive alternatives.   Advised to avoid cigarette smoking.  I discussed with the patient that most people either abstain from alcohol or drink within safe limits (<=14/week and <=4 drinks/occasion for males, <=7/weeks and <= 3  drinks/occasion for females) and that the risk for alcohol disorders and other health  effects rises proportionally with the number of drinks per week and how often a drinker exceeds daily limits.  Discussed cessation/primary prevention of drug use and availability of treatment for abuse.   Diet: Encouraged to adjust caloric intake to maintain  or achieve ideal body weight, to reduce intake of dietary saturated fat and total fat, to limit sodium intake by avoiding high sodium foods and not adding table salt, and to maintain adequate dietary potassium and calcium preferably from fresh fruits, vegetables, and low-fat dairy products.    stressed the importance of regular exercise  Injury prevention: Discussed safety belts, safety helmets, smoke detector, smoking near bedding or upholstery.   Dental health: Discussed importance of regular tooth brushing, flossing, and dental visits.    NEXT PREVENTATIVE PHYSICAL DUE IN 1 YEAR. Return in about 1 year (around 04/06/2025) for CPE.  Salih Williamson A Lexi Conaty

## 2024-04-06 NOTE — Assessment & Plan Note (Signed)
 Check CBC, iron, ferritin, and vitamin B12 and treat based on results.

## 2024-04-06 NOTE — Assessment & Plan Note (Signed)
Health maintenance reviewed and updated. Discussed nutrition, exercise. Follow-up 1 year.

## 2024-04-06 NOTE — Patient Instructions (Signed)
 It was great to see you!  We are checking your labs today and will let you know the results via mychart/phone.   Keep follow-up appointments with cardiology and GI  Let's follow-up in 1 year, sooner if you have concerns.  If a referral was placed today, you will be contacted for an appointment. Please note that routine referrals can sometimes take up to 3-4 weeks to process. Please call our office if you haven't heard anything after this time frame.  Take care,  Rheba Cedar, NP

## 2024-04-06 NOTE — Assessment & Plan Note (Signed)
 Chronic, stable. Check A1c and treat based on results.

## 2024-04-06 NOTE — Assessment & Plan Note (Signed)
 Chronic, stable. She is taking carafate 1 g BID which is controlling her symptoms. She states that she is following with GI and had a negative H pylori test. She is experiencing early satiety and has lost 15 pounds in the last 2 years without trying. Encouraged her to discuss this at her next GI appointment. Check TSH today.

## 2024-04-06 NOTE — Assessment & Plan Note (Signed)
 She is following with cardiology and taking a magnesium supplement daily. Check TSH and magnesium today.

## 2024-04-07 ENCOUNTER — Encounter: Payer: Self-pay | Admitting: Nurse Practitioner

## 2024-04-07 LAB — HIV ANTIBODY (ROUTINE TESTING W REFLEX): HIV 1&2 Ab, 4th Generation: NONREACTIVE

## 2024-04-07 LAB — HEPATITIS C ANTIBODY: Hepatitis C Ab: NONREACTIVE

## 2024-04-13 MED ORDER — BLOOD GLUCOSE MONITORING SUPPL DEVI: 1.0000 | Freq: Every day | 0 refills | Status: AC

## 2024-04-13 MED ORDER — LANCETS MISC. MISC: 1.0000 | Freq: Every day | 3 refills | Status: AC

## 2024-04-13 MED ORDER — BLOOD GLUCOSE TEST VI STRP: 1.0000 | ORAL_STRIP | Freq: Every day | 3 refills | Status: AC

## 2024-04-13 NOTE — Addendum Note (Signed)
 Addended by: Missael Ferrari A on: 04/13/2024 08:14 AM   Modules accepted: Orders

## 2024-05-24 ENCOUNTER — Ambulatory Visit (INDEPENDENT_AMBULATORY_CARE_PROVIDER_SITE_OTHER): Admitting: Nurse Practitioner

## 2024-05-24 ENCOUNTER — Encounter: Payer: Self-pay | Admitting: Nurse Practitioner

## 2024-05-24 VITALS — BP 102/72 | HR 102 | Temp 97.8°F | Ht 61.0 in | Wt 139.2 lb

## 2024-05-24 DIAGNOSIS — R7303 Prediabetes: Secondary | ICD-10-CM | POA: Diagnosis not present

## 2024-05-24 MED ORDER — METFORMIN HCL ER 500 MG PO TB24: 500.0000 mg | ORAL_TABLET | Freq: Every day | ORAL | 0 refills | Status: DC

## 2024-05-24 NOTE — Progress Notes (Signed)
 Established Patient Office Visit  Subjective   Patient ID: Kristina Lopez, female    DOB: 07-28-1986  Age: 38 y.o. MRN: [id number]  Chief Complaint  Patient presents with   Prediabetes    Discuss results    HPI Discussed the use of AI scribe software for clinical note transcription with the patient, who gave verbal consent to proceed.  History of Present Illness   Kristina Lopez is a 38 year old female with prediabetes who presents with concerns about elevated blood sugar levels.  She monitors her blood sugar every other day, with morning levels consistently over 100 mg/dL. Recent readings include 93, 87, 111, 110, 109, 99, 100, 102, and 117 mg/dL.  Her diet includes reduced sugar intake and limited rice and bread, though she allows herself a sweet treat daily. She suspects a smoothie bowl contributed to a recent reading of 117 mg/dL. Her typical meals are eggs and bacon for breakfast, and grilled chicken salad for lunch and dinner. She is cautious with carbohydrates and aware of the impact of certain fruits on her blood sugar.  She is physically active and has made dietary changes to manage her blood sugar levels. Despite these efforts, she is concerned about the effectiveness of her diet in controlling her blood sugar.  There is a significant family history of diabetes.       ROS See pertinent positives and negatives per HPI.    Objective:     BP 102/72 (BP Location: Left Arm, Patient Position: Sitting, Cuff Size: Normal)   Pulse (!) 102   Temp 97.8 F (36.6 C)   Ht 5\' 1"  (1.549 m)   Wt 139 lb 3.2 oz (63.1 kg)   LMP 05/10/2024 (Exact Date)   SpO2 98%   BMI 26.30 kg/m    Physical Exam Vitals and nursing note reviewed.  Constitutional:      General: She is not in acute distress.    Appearance: Normal appearance.  HENT:     Head: Normocephalic.  Eyes:     Conjunctiva/sclera: Conjunctivae normal.  Cardiovascular:     Rate and Rhythm: Normal rate and regular  rhythm.     Pulses: Normal pulses.     Heart sounds: Normal heart sounds.  Pulmonary:     Effort: Pulmonary effort is normal.     Breath sounds: Normal breath sounds.  Musculoskeletal:     Cervical back: Normal range of motion.  Skin:    General: Skin is warm.  Neurological:     General: No focal deficit present.     Mental Status: She is alert and oriented to person, place, and time.  Psychiatric:        Mood and Affect: Mood normal.        Behavior: Behavior normal.        Thought Content: Thought content normal.        Judgment: Judgment normal.      Assessment & Plan:   Problem List Items Addressed This Visit       Other   Prediabetes - Primary   Blood glucose levels remain elevated despite dietary changes, possibly due to family history. She opted for medication to manage levels. Initiated metformin XR 500mg  daily with food, and discussed potential gastrointestinal side effects. Monitor blood glucose every other morning or two to three times a week. Follow up in two months to reassess blood glucose and check hemoglobin A1c. Provided dietary guidance to limit carbohydrates to 200 grams per day,  encourage vegetables, lean proteins, and mindful fruit intake.          Return in about 2 months (around 07/24/2024) for prediabetes.    Odette Benjamin, NP

## 2024-05-24 NOTE — Assessment & Plan Note (Signed)
 Blood glucose levels remain elevated despite dietary changes, possibly due to family history. She opted for medication to manage levels. Initiated metformin XR 500mg  daily with food, and discussed potential gastrointestinal side effects. Monitor blood glucose every other morning or two to three times a week. Follow up in two months to reassess blood glucose and check hemoglobin A1c. Provided dietary guidance to limit carbohydrates to 200 grams per day, encourage vegetables, lean proteins, and mindful fruit intake.

## 2024-05-24 NOTE — Patient Instructions (Signed)
 It was great to see you!  Start metformin 1 tablet daily with food   Keep checking your sugars 2-3 times a week   Let's follow-up in 2 months, sooner if you have concerns.  If a referral was placed today, you will be contacted for an appointment. Please note that routine referrals can sometimes take up to 3-4 weeks to process. Please call our office if you haven't heard anything after this time frame.  Take care,  Rheba Cedar, NP

## 2024-06-28 ENCOUNTER — Other Ambulatory Visit: Payer: Self-pay | Admitting: Internal Medicine

## 2024-06-28 DIAGNOSIS — R1013 Epigastric pain: Secondary | ICD-10-CM

## 2024-06-28 DIAGNOSIS — R634 Abnormal weight loss: Secondary | ICD-10-CM | POA: Diagnosis not present

## 2024-07-19 ENCOUNTER — Ambulatory Visit: Admitting: Nurse Practitioner

## 2024-07-30 ENCOUNTER — Ambulatory Visit
Admission: RE | Admit: 2024-07-30 | Discharge: 2024-07-30 | Disposition: A | Discharge status: Home / Self Care | Source: Ambulatory Visit | Attending: Internal Medicine | Admitting: Internal Medicine

## 2024-07-30 DIAGNOSIS — R1013 Epigastric pain: Secondary | ICD-10-CM

## 2024-07-30 DIAGNOSIS — R634 Abnormal weight loss: Secondary | ICD-10-CM

## 2024-07-30 DIAGNOSIS — K5641 Fecal impaction: Secondary | ICD-10-CM | POA: Diagnosis not present

## 2024-07-30 MED ORDER — IOPAMIDOL (ISOVUE-370) INJECTION 76%
75.0000 mL | Freq: Once | INTRAVENOUS | Status: AC | PRN
  Administered 2024-07-30: 75 mL via INTRAVENOUS

## 2024-08-27 DIAGNOSIS — R634 Abnormal weight loss: Secondary | ICD-10-CM | POA: Diagnosis not present

## 2024-08-27 DIAGNOSIS — R1013 Epigastric pain: Secondary | ICD-10-CM | POA: Diagnosis not present

## 2024-08-30 ENCOUNTER — Other Ambulatory Visit: Payer: Self-pay | Admitting: Nurse Practitioner

## 2024-08-31 NOTE — Telephone Encounter (Signed)
 Requesting: METFORMIN  ER 500MG  24HR TABS  Last Visit: 05/24/2024 Next Visit: 09/03/2024 Last Refill: 05/24/2024  Please Advise

## 2024-09-03 ENCOUNTER — Ambulatory Visit (INDEPENDENT_AMBULATORY_CARE_PROVIDER_SITE_OTHER): Admitting: Nurse Practitioner

## 2024-09-03 VITALS — BP 98/66 | HR 73 | Temp 97.2°F | Ht 61.0 in | Wt 132.6 lb

## 2024-09-03 DIAGNOSIS — R634 Abnormal weight loss: Secondary | ICD-10-CM | POA: Diagnosis not present

## 2024-09-03 DIAGNOSIS — R7303 Prediabetes: Secondary | ICD-10-CM | POA: Diagnosis not present

## 2024-09-03 LAB — POCT GLYCOSYLATED HEMOGLOBIN (HGB A1C)
HbA1c POC (<> result, manual entry): 5.4 % (ref 4.0–5.6)
HbA1c, POC (controlled diabetic range): 5.4 % (ref 0.0–7.0)
HbA1c, POC (prediabetic range): 5.4 % — AB (ref 5.7–6.4)
Hemoglobin A1C: 5.4 % (ref 4.0–5.6)

## 2024-09-03 NOTE — Patient Instructions (Signed)
 It was great to see you!  Keep up the great work!   Check your blood sugar if you wake up sweating at night. If less than 70, have a snack and stop the metformin    Let's follow-up in 6 months, sooner if you have concerns.  If a referral was placed today, you will be contacted for an appointment. Please note that routine referrals can sometimes take up to 3-4 weeks to process. Please call our office if you haven't heard anything after this time frame.  Take care,  Tinnie Harada, NP

## 2024-09-03 NOTE — Progress Notes (Signed)
 Established Patient Office Visit  Subjective   Patient ID: Kristina Lopez, female    DOB: 03/09/1986  Age: 38 y.o. MRN: [id number]  Chief Complaint  Patient presents with   Prediabetes    Follow up, no concerns    HPI  Kristina Lopez is here to follow-up on prediabetes. She has started the metformin  and is tolerating it well. She has been checking her blood sugar at home and most days it is less than 100, with some days being in the low 100s. She has changed her diet and is exercising. She has noticed an increase in sweating at night. She denies cough, fever, and recent illness. She also denies chest pain and shortness of breath.   She notes that she is also experiencing unintentional weight loss. She is following with GI and has had numerous tests completed. She had her thyroid checked which was normal. She is discussing colonoscopy with GI specialist.     ROS See pertinent positives and negatives per HPI.    Objective:     BP 98/66 (BP Location: Left Arm, Patient Position: Sitting, Cuff Size: Normal)   Pulse 73   Temp (!) 97.2 F (36.2 C)   Ht 5' 1 (1.549 m)   Wt 132 lb 9.6 oz (60.1 kg)   LMP 08/04/2024 (Approximate)   SpO2 100%   BMI 25.05 kg/m  BP Readings from Last 3 Encounters:  09/03/24 98/66  05/24/24 102/72  04/06/24 102/66   Wt Readings from Last 3 Encounters:  09/03/24 132 lb 9.6 oz (60.1 kg)  05/24/24 139 lb 3.2 oz (63.1 kg)  04/06/24 148 lb 3.2 oz (67.2 kg)      Physical Exam Vitals and nursing note reviewed.  Constitutional:      General: She is not in acute distress.    Appearance: Normal appearance.  HENT:     Head: Normocephalic.  Eyes:     Conjunctiva/sclera: Conjunctivae normal.  Cardiovascular:     Rate and Rhythm: Normal rate and regular rhythm.     Pulses: Normal pulses.     Heart sounds: Normal heart sounds.  Pulmonary:     Effort: Pulmonary effort is normal.     Breath sounds: Normal breath sounds.  Musculoskeletal:      Cervical back: Normal range of motion.  Skin:    General: Skin is warm.  Neurological:     General: No focal deficit present.     Mental Status: She is alert and oriented to person, place, and time.  Psychiatric:        Mood and Affect: Mood normal.        Behavior: Behavior normal.        Thought Content: Thought content normal.        Judgment: Judgment normal.      Results for orders placed or performed in visit on 09/03/24  POCT glycosylated hemoglobin (Hb A1C)  Result Value Ref Range   Hemoglobin A1C 5.4 4.0 - 5.6 %   HbA1c POC (<> result, manual entry) 5.4 4.0 - 5.6 %   HbA1c, POC (prediabetic range) 5.4 (A) 5.7 - 6.4 %   HbA1c, POC (controlled diabetic range) 5.4 0.0 - 7.0 %     Assessment & Plan:   Problem List Items Addressed This Visit       Other   Prediabetes - Primary   Chronic, stable. A1c is 5.4%. She is taking metformin  XR 500mg  daily. She has started having increased night sweats. Encouraged  her to check her blood sugar during an episode and if less than 70, she should have a snack and stop metformin .       Relevant Orders   POCT glycosylated hemoglobin (Hb A1C) (Completed)   Other Visit Diagnoses       Unexplained weight loss       She is eating well with still losing weight. Recent CMP, TSH, and vitamins normal. Continue collaboration with GI.       Return in about 6 months (around 03/03/2025) for prediabetes, weight.    Kristina DELENA Harada, NP

## 2024-09-03 NOTE — Assessment & Plan Note (Signed)
 Chronic, stable. A1c is 5.4%. She is taking metformin  XR 500mg  daily. She has started having increased night sweats. Encouraged her to check her blood sugar during an episode and if less than 70, she should have a snack and stop metformin .

## 2024-09-14 ENCOUNTER — Telehealth: Payer: Self-pay

## 2024-09-14 NOTE — Telephone Encounter (Signed)
 Copied from CRM [id number]. Topic: General - Other >> Sep 14, 2024  8:11 AM Thersia BROCKS wrote: Reason for CRM: Patient called in regarding needing to know if NP Lauren has got in contact with her [facility] doctor, stated she has been wanting so she can get an okay for her procedure, would like a callback regarding this  [phone]

## 2024-09-15 ENCOUNTER — Encounter: Payer: Self-pay | Admitting: Nurse Practitioner

## 2024-09-15 NOTE — Telephone Encounter (Unsigned)
 Copied from CRM #[id number]. Topic: General - Other >> Sep 15, 2024 10:40 AM [place] A wrote: Reason for CRM: Patient was returning call for Brittany-Agent called [facility] but CMA was busy-please call

## 2024-09-15 NOTE — Telephone Encounter (Signed)
 Left message to return call

## 2024-09-15 NOTE — Telephone Encounter (Signed)
 Letter faxed to Dr. Kriss at [phone].

## 2024-09-15 NOTE — Telephone Encounter (Signed)
 Left message for patient to return call.

## 2024-09-16 NOTE — Telephone Encounter (Signed)
 Patient notified via Mychart message.

## 2024-10-21 DIAGNOSIS — R1013 Epigastric pain: Secondary | ICD-10-CM | POA: Diagnosis not present

## 2024-10-21 DIAGNOSIS — R634 Abnormal weight loss: Secondary | ICD-10-CM | POA: Diagnosis not present

## 2024-10-21 LAB — HM COLONOSCOPY

## 2024-12-13 ENCOUNTER — Encounter: Payer: Self-pay | Admitting: Nurse Practitioner

## 2024-12-13 ENCOUNTER — Ambulatory Visit (INDEPENDENT_AMBULATORY_CARE_PROVIDER_SITE_OTHER): Admitting: Nurse Practitioner

## 2024-12-13 VITALS — BP 92/60 | HR 73 | Temp 97.9°F | Ht 61.0 in | Wt 128.8 lb

## 2024-12-13 DIAGNOSIS — Z23 Encounter for immunization: Secondary | ICD-10-CM

## 2024-12-13 DIAGNOSIS — R7303 Prediabetes: Secondary | ICD-10-CM

## 2024-12-13 DIAGNOSIS — R634 Abnormal weight loss: Secondary | ICD-10-CM | POA: Diagnosis not present

## 2024-12-13 DIAGNOSIS — R61 Generalized hyperhidrosis: Secondary | ICD-10-CM

## 2024-12-13 LAB — COMPREHENSIVE METABOLIC PANEL WITH GFR
ALT: 9 U/L (ref 3–35)
AST: 15 U/L (ref 5–37)
Albumin: 4.4 g/dL (ref 3.5–5.2)
Alkaline Phosphatase: 42 U/L (ref 39–117)
BUN: 10 mg/dL (ref 6–23)
CO2: 29 meq/L (ref 19–32)
Calcium: 9.2 mg/dL (ref 8.4–10.5)
Chloride: 102 meq/L (ref 96–112)
Creatinine, Ser: 0.83 mg/dL (ref 0.40–1.20)
GFR: 90.19 mL/min
Glucose, Bld: 84 mg/dL (ref 70–99)
Potassium: 3.7 meq/L (ref 3.5–5.1)
Sodium: 138 meq/L (ref 135–145)
Total Bilirubin: 0.6 mg/dL (ref 0.2–1.2)
Total Protein: 7.2 g/dL (ref 6.0–8.3)

## 2024-12-13 LAB — CBC WITH DIFFERENTIAL/PLATELET
Basophils Absolute: 0 K/uL (ref 0.0–0.1)
Basophils Relative: 0.9 % (ref 0.0–3.0)
Eosinophils Absolute: 0 K/uL (ref 0.0–0.7)
Eosinophils Relative: 0.4 % (ref 0.0–5.0)
HCT: 38.8 % (ref 36.0–46.0)
Hemoglobin: 13 g/dL (ref 12.0–15.0)
Lymphocytes Relative: 40.3 % (ref 12.0–46.0)
Lymphs Abs: 1.6 K/uL (ref 0.7–4.0)
MCHC: 33.4 g/dL (ref 30.0–36.0)
MCV: 93 fl (ref 78.0–100.0)
Monocytes Absolute: 0.2 K/uL (ref 0.1–1.0)
Monocytes Relative: 5.3 % (ref 3.0–12.0)
Neutro Abs: 2.1 K/uL (ref 1.4–7.7)
Neutrophils Relative %: 53.1 % (ref 43.0–77.0)
Platelets: 272 K/uL (ref 150.0–400.0)
RBC: 4.17 Mil/uL (ref 3.87–5.11)
RDW: 13.2 % (ref 11.5–15.5)
WBC: 3.9 K/uL — ABNORMAL LOW (ref 4.0–10.5)

## 2024-12-13 LAB — T4, FREE: Free T4: 0.89 ng/dL (ref 0.60–1.60)

## 2024-12-13 LAB — TSH: TSH: 0.46 u[IU]/mL (ref 0.35–5.50)

## 2024-12-13 LAB — HEMOGLOBIN A1C: Hgb A1c MFr Bld: 5.6 % (ref 4.6–6.5)

## 2024-12-13 LAB — TESTOSTERONE: Testosterone: 26.99 ng/dL (ref 15.00–40.00)

## 2024-12-13 NOTE — Assessment & Plan Note (Signed)
 She experiences persistent weight loss and night sweats despite normal colonoscopy and CT scan results. Check CMP, CBC, TB gold, ANA, HIV, TSH, free T4, FSH/LH today.

## 2024-12-13 NOTE — Assessment & Plan Note (Signed)
 Previous elevated blood sugar levels noted, not currently on metformin . Check A1c today.

## 2024-12-13 NOTE — Patient Instructions (Signed)
 It was great to see you!  We are checking your labs today and will let you know the results via mychart/phone.   We are updating your tetanus   Let's follow-up in 3 months  Take care,  Tinnie Harada, NP

## 2024-12-13 NOTE — Progress Notes (Signed)
 "  Established Patient Office Visit  Subjective   Patient ID: Kristina Lopez, female    DOB: 1986-07-11  Age: 38 y.o. MRN: [id number]  Chief Complaint  Patient presents with   Weight Loss    Follow up    HPI  Discussed the use of AI scribe software for clinical note transcription with the patient, who gave verbal consent to proceed.  History of Present Illness   Kristina Lopez is a 38 year old female who presents with unexplained weight loss and excessive night sweats.  She has ongoing unexplained weight loss despite normal colonoscopy and lab work. She stopped metformin  but the weight loss persists. Her appetite fluctuates and she sometimes forces herself to eat more than her usual two meals a day. She typically eats a substantial breakfast and a light snack and dinner.  She has severe nightly sweats, often waking in soaked bedding and needing to shower. Her sleep is disrupted by this. She denies fever, cough, other respiratory symptoms, or rashes. She had a colonoscopy which was normal.   She exercises regularly with treadmill and weights. She denies chest pain, shortness of breath, lightheadedness, or headaches.     ROS See pertinent positives and negatives per HPI.    Objective:     BP 92/60 (BP Location: Right Arm, Patient Position: Sitting, Cuff Size: Small)   Pulse 73   Temp 97.9 F (36.6 C)   Ht 5' 1 (1.549 m)   Wt 128 lb 12.8 oz (58.4 kg)   LMP 12/05/2024 (Exact Date)   SpO2 98%   BMI 24.34 kg/m  BP Readings from Last 3 Encounters:  12/13/24 92/60  09/03/24 98/66  05/24/24 102/72   Wt Readings from Last 3 Encounters:  12/13/24 128 lb 12.8 oz (58.4 kg)  09/03/24 132 lb 9.6 oz (60.1 kg)  05/24/24 139 lb 3.2 oz (63.1 kg)      Physical Exam Vitals and nursing note reviewed.  Constitutional:      General: She is not in acute distress.    Appearance: Normal appearance.  HENT:     Head: Normocephalic.  Eyes:     Conjunctiva/sclera: Conjunctivae normal.   Cardiovascular:     Rate and Rhythm: Normal rate and regular rhythm.     Pulses: Normal pulses.     Heart sounds: Normal heart sounds.  Pulmonary:     Effort: Pulmonary effort is normal.     Breath sounds: Normal breath sounds.  Musculoskeletal:     Cervical back: Normal range of motion.  Skin:    General: Skin is warm.  Neurological:     General: No focal deficit present.     Mental Status: She is alert and oriented to person, place, and time.  Psychiatric:        Mood and Affect: Mood normal.        Behavior: Behavior normal.        Thought Content: Thought content normal.        Judgment: Judgment normal.        Assessment & Plan:   Problem List Items Addressed This Visit       Other   Prediabetes   Previous elevated blood sugar levels noted, not currently on metformin . Check A1c today.       Relevant Orders   Hemoglobin A1c   Comprehensive metabolic panel with GFR   Unintentional weight loss - Primary   She experiences persistent weight loss and night sweats despite normal colonoscopy and  CT scan results. Check CMP, CBC, TB gold, ANA, HIV, TSH, free T4, FSH/LH today.       Relevant Orders   QuantiFERON-TB Gold Plus   TSH   T4, free   FSH/LH   Testosterone    HIV Antibody (routine testing w rflx)   ANA w/Reflex   CBC with Differential/Platelet   Comprehensive metabolic panel with GFR   Night sweats   She experiences persistent weight loss and night sweats despite normal colonoscopy and CT scan results. Check CMP, CBC, TB gold, ANA, HIV, TSH, free T4, FSH/LH today.       Relevant Orders   QuantiFERON-TB Gold Plus   TSH   T4, free   FSH/LH   Testosterone    HIV Antibody (routine testing w rflx)   ANA w/Reflex   CBC with Differential/Platelet   Comprehensive metabolic panel with GFR   Other Visit Diagnoses       Immunization due       Tdap given today   Relevant Orders   Tdap vaccine greater than or equal to 7yo IM (Completed)       Return  in about 3 months (around 03/13/2025) for unintentional weight loss.    Tinnie DELENA Harada, NP  "

## 2024-12-14 ENCOUNTER — Ambulatory Visit: Payer: Self-pay | Admitting: Nurse Practitioner

## 2024-12-14 LAB — ANA W/REFLEX: Anti Nuclear Antibody (ANA): NEGATIVE

## 2024-12-15 LAB — QUANTIFERON-TB GOLD PLUS
Mitogen-NIL: 8.47 [IU]/mL
NIL: 0.02 [IU]/mL
QuantiFERON-TB Gold Plus: NEGATIVE
TB1-NIL: 0 [IU]/mL
TB2-NIL: 0 [IU]/mL

## 2024-12-15 LAB — FSH/LH
FSH: 6.4 m[IU]/mL
LH: 5.9 m[IU]/mL

## 2024-12-15 LAB — HIV ANTIBODY (ROUTINE TESTING W REFLEX)
HIV 1&2 Ab, 4th Generation: NONREACTIVE
HIV FINAL INTERPRETATION: NEGATIVE

## 2024-12-20 ENCOUNTER — Ambulatory Visit: Admitting: Nurse Practitioner

## 2024-12-24 ENCOUNTER — Ambulatory Visit: Payer: Self-pay | Admitting: Nurse Practitioner

## 2024-12-24 ENCOUNTER — Telehealth: Payer: Self-pay | Admitting: Nurse Practitioner

## 2024-12-24 ENCOUNTER — Other Ambulatory Visit

## 2024-12-24 DIAGNOSIS — R61 Generalized hyperhidrosis: Secondary | ICD-10-CM | POA: Diagnosis not present

## 2024-12-24 DIAGNOSIS — R634 Abnormal weight loss: Secondary | ICD-10-CM

## 2024-12-24 DIAGNOSIS — R700 Elevated erythrocyte sedimentation rate: Secondary | ICD-10-CM

## 2024-12-24 LAB — SEDIMENTATION RATE: Sed Rate: 29 mm/h — ABNORMAL HIGH (ref 0–20)

## 2024-12-24 LAB — C-REACTIVE PROTEIN: CRP: <0.5 mg/dL — ABNORMAL LOW (ref 1.0–20.0)

## 2024-12-24 NOTE — Telephone Encounter (Signed)
 Called patient, but she did not answer. Left voice mail to call back. Spoke with Dr. Thedora and he recommended checking 2 more inflammatory markers. If these are normal, we will refer you to endocrinology. This can be with a lab visit (needs to be scheduled).

## 2024-12-24 NOTE — Telephone Encounter (Signed)
 I called and spoke with patient and she came to office today and had labs drawn.

## 2024-12-24 NOTE — Telephone Encounter (Signed)
 Noted

## 2025-03-18 ENCOUNTER — Ambulatory Visit: Admitting: Nurse Practitioner

## 2025-04-01 ENCOUNTER — Ambulatory Visit
